# Patient Record
Sex: Male | Born: 1985 | Race: Black or African American | Hispanic: No | Marital: Single | Smoking: Former smoker
Health system: Southern US, Community
[De-identification: ages and names within clinical notes are randomized; demographics above are authoritative.]

## PROBLEM LIST (undated history)

## (undated) DIAGNOSIS — C8590 Non-Hodgkin lymphoma, unspecified, unspecified site: Secondary | ICD-10-CM

## (undated) HISTORY — PX: NECK SURGERY: SHX720

## (undated) HISTORY — PX: PORTA CATH REMOVAL: CATH118286

---

## 2009-02-07 ENCOUNTER — Emergency Department (HOSPITAL_COMMUNITY): Admission: EM | Admit: 2009-02-07 | Discharge: 2009-02-07 | Payer: Self-pay | Admitting: Emergency Medicine

## 2011-01-24 ENCOUNTER — Encounter: Payer: Self-pay | Admitting: Emergency Medicine

## 2011-01-24 ENCOUNTER — Emergency Department (HOSPITAL_COMMUNITY)
Admission: EM | Admit: 2011-01-24 | Discharge: 2011-01-25 | Discharge status: Against Medical Advice | Payer: Self-pay | Attending: Emergency Medicine | Admitting: Emergency Medicine

## 2011-01-24 DIAGNOSIS — R6883 Chills (without fever): Secondary | ICD-10-CM | POA: Insufficient documentation

## 2011-01-24 DIAGNOSIS — R059 Cough, unspecified: Secondary | ICD-10-CM | POA: Insufficient documentation

## 2011-01-24 DIAGNOSIS — R110 Nausea: Secondary | ICD-10-CM | POA: Insufficient documentation

## 2011-01-24 DIAGNOSIS — R05 Cough: Secondary | ICD-10-CM | POA: Insufficient documentation

## 2011-01-24 DIAGNOSIS — R109 Unspecified abdominal pain: Secondary | ICD-10-CM | POA: Insufficient documentation

## 2011-01-24 NOTE — ED Notes (Signed)
Pt in c/o cough and LLQ pain x2 days, states cough started first and has progressed, abd pain is intermittent, pt in no distress at this time, denies n/v, also c/o difficulty sleeping over last week

## 2011-01-24 NOTE — ED Notes (Signed)
Pt presented to the ER with c/o cold s/s and also lower abd pain and right sharp CP 6/10 at worse and at its best 4/10 . S/s started yesterday and since then s/s worsen, pt with dry cough, states when coughing noted lower abd pain, 8/10, pt reports taking goody powder, claratin, Midol, nightquil, reports difficulty sleeping.

## 2011-01-25 ENCOUNTER — Emergency Department (HOSPITAL_COMMUNITY): Payer: Self-pay
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
## 2011-01-25 NOTE — ED Notes (Signed)
Pt noted to be walking out of department, made statements of leaving earlier

## 2011-01-26 NOTE — ED Provider Notes (Signed)
Medical screening examination/treatment/procedure(s) were performed by non-physician practitioner and as supervising physician I was immediately available for consultation/collaboration.   Brenlee Koskela W Jasmarie Coppock, MD 01/26/11 0715 

## 2012-06-08 ENCOUNTER — Emergency Department (HOSPITAL_COMMUNITY)
Admission: EM | Admit: 2012-06-08 | Discharge: 2012-06-08 | Disposition: A | Discharge status: Home / Self Care | Payer: BC Managed Care – PPO | Attending: Emergency Medicine | Admitting: Emergency Medicine

## 2012-06-08 ENCOUNTER — Encounter (HOSPITAL_COMMUNITY): Payer: Self-pay | Admitting: *Deleted

## 2012-06-08 DIAGNOSIS — S025XXA Fracture of tooth (traumatic), initial encounter for closed fracture: Secondary | ICD-10-CM | POA: Insufficient documentation

## 2012-06-08 DIAGNOSIS — Y9239 Other specified sports and athletic area as the place of occurrence of the external cause: Secondary | ICD-10-CM | POA: Insufficient documentation

## 2012-06-08 DIAGNOSIS — S032XXA Dislocation of tooth, initial encounter: Secondary | ICD-10-CM

## 2012-06-08 DIAGNOSIS — Y92838 Other recreation area as the place of occurrence of the external cause: Secondary | ICD-10-CM | POA: Insufficient documentation

## 2012-06-08 DIAGNOSIS — F17200 Nicotine dependence, unspecified, uncomplicated: Secondary | ICD-10-CM | POA: Insufficient documentation

## 2012-06-08 DIAGNOSIS — Y9367 Activity, basketball: Secondary | ICD-10-CM | POA: Insufficient documentation

## 2012-06-08 DIAGNOSIS — W219XXA Striking against or struck by unspecified sports equipment, initial encounter: Secondary | ICD-10-CM | POA: Insufficient documentation

## 2012-06-08 MED ORDER — PERCOCET 5-325 MG PO TABS: 1.0000 | ORAL_TABLET | Freq: Four times a day (QID) | ORAL | Status: DC | PRN

## 2012-06-08 MED ORDER — OXYCODONE-ACETAMINOPHEN 5-325 MG PO TABS
2.0000 | ORAL_TABLET | Freq: Once | ORAL | Status: AC
  Administered 2012-06-08: 2 via ORAL
  Filled 2012-06-08: qty 2

## 2012-06-08 MED ORDER — PENICILLIN V POTASSIUM 500 MG PO TABS: 500.0000 mg | ORAL_TABLET | Freq: Four times a day (QID) | ORAL | Status: DC

## 2012-06-08 NOTE — ED Provider Notes (Signed)
Medical screening examination/treatment/procedure(s) were performed by non-physician practitioner and as supervising physician I was immediately available for consultation/collaboration.  Doug Sou, MD 06/08/12 [phone]

## 2012-06-08 NOTE — ED Notes (Signed)
Pt was elbowed to top right jaw Fri while playing basketball; states face hurts; feels like tooth broken and shoved into jaw
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
## 2012-06-09 ENCOUNTER — Encounter (HOSPITAL_COMMUNITY): Payer: Self-pay | Admitting: Emergency Medicine

## 2012-06-09 ENCOUNTER — Emergency Department (HOSPITAL_COMMUNITY)
Admission: EM | Admit: 2012-06-09 | Discharge: 2012-06-09 | Disposition: A | Discharge status: Home / Self Care | Payer: BC Managed Care – PPO | Attending: Emergency Medicine | Admitting: Emergency Medicine

## 2012-06-09 DIAGNOSIS — Y929 Unspecified place or not applicable: Secondary | ICD-10-CM | POA: Insufficient documentation

## 2012-06-09 DIAGNOSIS — X58XXXA Exposure to other specified factors, initial encounter: Secondary | ICD-10-CM | POA: Insufficient documentation

## 2012-06-09 DIAGNOSIS — S025XXD Fracture of tooth (traumatic), subsequent encounter for fracture with routine healing: Secondary | ICD-10-CM

## 2012-06-09 DIAGNOSIS — Y939 Activity, unspecified: Secondary | ICD-10-CM | POA: Insufficient documentation

## 2012-06-09 DIAGNOSIS — S025XXA Fracture of tooth (traumatic), initial encounter for closed fracture: Secondary | ICD-10-CM | POA: Insufficient documentation

## 2012-06-09 DIAGNOSIS — F17200 Nicotine dependence, unspecified, uncomplicated: Secondary | ICD-10-CM | POA: Insufficient documentation

## 2012-06-09 MED ORDER — BUPIVACAINE-EPINEPHRINE PF 0.5-1:200000 % IJ SOLN
1.8000 mL | Freq: Once | INTRAMUSCULAR | Status: AC
  Administered 2012-06-09: 9 mg
  Filled 2012-06-09: qty 1.8

## 2012-06-09 MED ORDER — OXYCODONE-ACETAMINOPHEN 5-325 MG PO TABS
2.0000 | ORAL_TABLET | Freq: Once | ORAL | Status: AC
  Administered 2012-06-09: 2 via ORAL
  Filled 2012-06-09: qty 2
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
## 2012-06-09 NOTE — Progress Notes (Signed)
CM noted pt seen x 2 on 06/09/12 [facility] ED CM noted pt with coverage but no pcp listed [facility] ED CM spoke with pt on how to obtain an in network pcp with insurance coverage via the customer service number or web site

## 2012-06-09 NOTE — ED Provider Notes (Signed)
  Medical screening examination/treatment/procedure(s) were performed by non-physician practitioner and as supervising physician I was immediately available for consultation/collaboration.    Undra Trembath, MD 06/09/12 2123 

## 2012-06-09 NOTE — ED Notes (Signed)
Increased pain in r/upper mouth due to dental injury 3 days ago

## 2012-07-11 ENCOUNTER — Emergency Department (HOSPITAL_COMMUNITY)
Admission: EM | Admit: 2012-07-11 | Discharge: 2012-07-12 | Disposition: A | Discharge status: Home / Self Care | Payer: BC Managed Care – PPO | Attending: Emergency Medicine | Admitting: Emergency Medicine

## 2012-07-11 ENCOUNTER — Encounter (HOSPITAL_COMMUNITY): Payer: Self-pay | Admitting: *Deleted

## 2012-07-11 DIAGNOSIS — K0889 Other specified disorders of teeth and supporting structures: Secondary | ICD-10-CM

## 2012-07-11 DIAGNOSIS — F17200 Nicotine dependence, unspecified, uncomplicated: Secondary | ICD-10-CM | POA: Insufficient documentation

## 2012-07-11 DIAGNOSIS — R599 Enlarged lymph nodes, unspecified: Secondary | ICD-10-CM

## 2012-07-11 DIAGNOSIS — K089 Disorder of teeth and supporting structures, unspecified: Secondary | ICD-10-CM | POA: Insufficient documentation

## 2012-07-11 LAB — CBC WITH DIFFERENTIAL/PLATELET
Basophils Relative: 0 % (ref 0–1)
Eosinophils Relative: 2 % (ref 0–5)
HCT: 28.4 % — ABNORMAL LOW (ref 39.0–52.0)
Hemoglobin: 8.9 g/dL — ABNORMAL LOW (ref 13.0–17.0)
Lymphs Abs: 2.3 10*3/uL (ref 0.7–4.0)
MCH: 23.2 pg — ABNORMAL LOW (ref 26.0–34.0)
MCV: 74.2 fL — ABNORMAL LOW (ref 78.0–100.0)
Monocytes Absolute: 1.4 10*3/uL — ABNORMAL HIGH (ref 0.1–1.0)
Monocytes Relative: 14 % — ABNORMAL HIGH (ref 3–12)
Platelets: 416 10*3/uL — ABNORMAL HIGH (ref 150–400)
RBC: 3.83 MIL/uL — ABNORMAL LOW (ref 4.22–5.81)
WBC: 10.3 10*3/uL (ref 4.0–10.5)

## 2012-07-11 LAB — BASIC METABOLIC PANEL
Chloride: 102 mEq/L (ref 96–112)
GFR calc Af Amer: >90 mL/min (ref >90)
GFR calc non Af Amer: >90 mL/min (ref >90)
Glucose, Bld: 92 mg/dL (ref 70–99)
Potassium: 3.8 mEq/L (ref 3.5–5.1)
Sodium: 139 mEq/L (ref 135–145)

## 2012-07-11 MED ORDER — AMOXICILLIN 500 MG PO CAPS: 500.0000 mg | ORAL_CAPSULE | Freq: Two times a day (BID) | ORAL | Status: DC

## 2012-07-11 MED ORDER — ONDANSETRON 4 MG PO TBDP
4.0000 mg | ORAL_TABLET | Freq: Once | ORAL | Status: AC
  Administered 2012-07-11: 4 mg via ORAL
  Filled 2012-07-11: qty 1

## 2012-07-11 MED ORDER — HYDROCODONE-ACETAMINOPHEN 5-325 MG PO TABS: 1.0000 | ORAL_TABLET | Freq: Four times a day (QID) | ORAL | Status: DC | PRN

## 2012-07-11 MED ORDER — OXYCODONE-ACETAMINOPHEN 5-325 MG PO TABS
2.0000 | ORAL_TABLET | Freq: Once | ORAL | Status: AC
  Administered 2012-07-11: 2 via ORAL
  Filled 2012-07-11: qty 2

## 2012-07-11 NOTE — ED Notes (Signed)
Pt c/o R sided facial pain secondary to an dental injury sustained x 1 month ago.
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
## 2012-07-11 NOTE — ED Notes (Signed)
Pt ambulatory to exam room. Pt with no acute distress. Pt states he walked here to ED. Pt states he can get a ride home if needed.

## 2012-07-14 ENCOUNTER — Telehealth: Payer: Self-pay | Admitting: Oncology

## 2012-07-14 NOTE — Telephone Encounter (Signed)
PT SCHEDULED PER MD 6/12

## 2012-07-16 ENCOUNTER — Ambulatory Visit: Payer: BC Managed Care – PPO

## 2012-07-16 ENCOUNTER — Other Ambulatory Visit: Payer: BC Managed Care – PPO | Admitting: Lab

## 2012-07-16 ENCOUNTER — Other Ambulatory Visit: Payer: Self-pay | Admitting: Emergency Medicine

## 2012-07-16 ENCOUNTER — Telehealth: Payer: Self-pay | Admitting: Oncology

## 2012-07-16 ENCOUNTER — Ambulatory Visit: Payer: BC Managed Care – PPO | Admitting: Oncology

## 2012-07-16 DIAGNOSIS — R599 Enlarged lymph nodes, unspecified: Secondary | ICD-10-CM

## 2012-07-16 NOTE — Telephone Encounter (Signed)
C/D 07/16/12 for appt. 07/16/12 °

## 2012-07-17 NOTE — ED Provider Notes (Signed)
Medical screening examination/treatment/procedure(s) were performed by non-physician practitioner and as supervising physician I was immediately available for consultation/collaboration.   Christopher J. Pollina, MD 07/17/12 1050 

## 2012-07-18 NOTE — ED Provider Notes (Signed)
Medical screening examination/treatment/procedure(s) were performed by non-physician practitioner and as supervising physician I was immediately available for consultation/collaboration.   Juan Olthoff J. Susan Arana, MD 07/18/12 1101 

## 2012-07-22 ENCOUNTER — Emergency Department (HOSPITAL_COMMUNITY)
Admission: EM | Admit: 2012-07-22 | Discharge: 2012-07-22 | Disposition: A | Discharge status: Home / Self Care | Payer: BC Managed Care – PPO | Attending: Emergency Medicine | Admitting: Emergency Medicine

## 2012-07-22 ENCOUNTER — Encounter (HOSPITAL_COMMUNITY): Payer: Self-pay

## 2012-07-22 DIAGNOSIS — Z87891 Personal history of nicotine dependence: Secondary | ICD-10-CM | POA: Insufficient documentation

## 2012-07-22 DIAGNOSIS — Z87828 Personal history of other (healed) physical injury and trauma: Secondary | ICD-10-CM | POA: Insufficient documentation

## 2012-07-22 DIAGNOSIS — R61 Generalized hyperhidrosis: Secondary | ICD-10-CM | POA: Insufficient documentation

## 2012-07-22 DIAGNOSIS — R599 Enlarged lymph nodes, unspecified: Secondary | ICD-10-CM

## 2012-07-22 DIAGNOSIS — K0889 Other specified disorders of teeth and supporting structures: Secondary | ICD-10-CM

## 2012-07-22 DIAGNOSIS — K089 Disorder of teeth and supporting structures, unspecified: Secondary | ICD-10-CM | POA: Insufficient documentation

## 2012-07-22 MED ORDER — HYDROCODONE-ACETAMINOPHEN 5-325 MG PO TABS: 2.0000 | ORAL_TABLET | ORAL | Status: DC | PRN

## 2012-07-22 MED ORDER — AMOXICILLIN 500 MG PO CAPS: 500.0000 mg | ORAL_CAPSULE | Freq: Three times a day (TID) | ORAL | Status: DC

## 2012-07-22 NOTE — ED Notes (Signed)
Pt states was given a referral from here to see a dentist and was unable to see him, he went to another but sent back here for another referral. Pt c/o rt upper broken tooth that needs to be removed, with facial pain and swelling, requesting a dental block for pain until he can see the dentist tomorrow

## 2012-07-22 NOTE — ED Provider Notes (Signed)
Medical screening examination/treatment/procedure(s) were performed by non-physician practitioner and as supervising physician I was immediately available for consultation/collaboration.  Derwood Kaplan, MD 07/22/12 1615

## 2012-07-22 NOTE — ED Provider Notes (Signed)
History     CSN: [id number]  Arrival date & time 07/22/12  1031   First MD Initiated Contact with Patient 07/22/12 1038      Chief Complaint  Patient presents with  . Dental Pain    (Consider location/radiation/quality/duration/timing/severity/associated sxs/prior treatment) HPI  Calvin Mercer is a 27 y.o.male presenting to the ER with complaints of  constant severe right-sided tooth pain secondary to a dental injury while playing basketball onset greater than 1 month. He was seen here previously and given a referral for a dentist but did not know he needed to call within a certain time frame. When he came a week ago it was also noted that he had large posterior cervical lymphadenopathy and was set up to see Dr. Park Breed (hem/oncc). He did not realize that the appt was set from him for 07/16/2012 and did not make it. His mom has taken over and is now coordinating getting him in to be seen. He said last time the dental block helped for 1 week. He has fnished his abx Denies fever, otalgia, neck pain, headaches, cough, and sore throat. Reports trying Orajel, acetaminophen, and BC powders, amoxicillin with no relief of symptoms.     History reviewed. No pertinent past medical history.  Past Surgical History  Procedure Laterality Date  . Neck surgery      forein body removal, pelet from the gun    Family History  Problem Relation Age of Onset  . Cancer Other     History  Substance Use Topics  . Smoking status: Former Smoker    Types: Cigarettes  . Smokeless tobacco: Not on file  . Alcohol Use: No      Review of Systems Constitutional: Negative for fever.  Soaking night sweats  HENT: Positive for dental problem.  Respiratory: Negative for cough.  Cardiovascular: Negative for chest pain.  Hematological: Positive for adenopathy.  Psychiatric/Behavioral: Negative for confusion.  All other systems reviewed and are negative.  Allergies  Review of patient's allergies  indicates no known allergies.  Home Medications   Current Outpatient Rx  Name  Route  Sig  Dispense  Refill  . acetaminophen (TYLENOL) 500 MG tablet   Oral   Take 500 mg by mouth every 6 (six) hours as needed for pain.         Marland Kitchen amoxicillin (AMOXIL) 500 MG capsule   Oral   Take 1 capsule (500 mg total) by mouth 2 (two) times daily.   20 capsule   0   . amoxicillin (AMOXIL) 500 MG capsule   Oral   Take 1 capsule (500 mg total) by mouth 3 (three) times daily.   21 capsule   0   . aspirin EC 325 MG tablet   Oral   Take 650 mg by mouth every 6 (six) hours as needed for pain.         . Aspirin-Salicylamide-Caffeine (BC FAST PAIN RELIEF) 650-195-33.3 MG PACK   Oral   Take 1 Package by mouth 2 (two) times daily as needed (pain).         . benzocaine (ORAJEL) 10 % mucosal gel   Mouth/Throat   Use as directed 1 application in the mouth or throat 3 (three) times daily as needed for pain.         Marland Kitchen HYDROcodone-acetaminophen (NORCO) 5-325 MG per tablet   Oral   Take 1-2 tablets by mouth every 6 (six) hours as needed for pain.   20 tablet   0   .  HYDROcodone-acetaminophen (NORCO/VICODIN) 5-325 MG per tablet   Oral   Take 2 tablets by mouth every 4 (four) hours as needed for pain.   12 tablet   0     BP 114/64  Pulse 99  Temp(Src) 99.9 F (37.7 C) (Oral)  Resp 20  SpO2 99%  Physical Exam Nursing note and vitals reviewed.  Constitutional: He is oriented to person, place, and time. He appears well-developed and well-nourished. No distress.  HENT:  Head: Normocephalic and atraumatic. No trismus in the jaw.  Mouth/Throat: Uvula is midline and mucous membranes are normal. No dental caries. No oropharyngeal exudate, posterior oropharyngeal edema or posterior oropharyngeal erythema.  Upper left second molar is impacted and everted 160 degrees  Eyes: EOM are normal.  Neck: Normal range of motion. Neck supple. No tracheal deviation present.  Normal passive ROM of neck   Cardiovascular: Normal rate.  Pulmonary/Chest: Effort normal. No respiratory distress.  Musculoskeletal: Normal range of motion.  Lymphadenopathy:  He has cervical adenopathy.  Bilateral firm non-tender cervical adenopathy greater than 5 cm along the posterior chain  Neurological: He is alert and oriented to person, place, and time.  Skin: Skin is warm and dry.  Psychiatric: He has a normal mood and affect. His behavior is normal.   ED Course  Dental Date/Time: 07/22/2012 11:21 AM Performed by: Dorthula Matas Authorized by: Dorthula Matas Consent: Verbal consent obtained. Risks and benefits: risks, benefits and alternatives were discussed Consent given by: patient Patient identity confirmed: verbally with patient Local anesthesia used: yes Local anesthetic: topical anesthetic and bupivacaine 0.25% without epinephrine Anesthetic total: 2 ml Patient sedated: no Comments: Pt tolerated procedure well with good pain relief   (including critical care time)   Labs Reviewed - No data to display No results found.   1. Lymph node enlargement   2. Pain, dental       MDM  Pt arranged to follow up with Dr. Welton Flakes regarding lymphadenopathy and Dr. Chales Salmon with dental and dr. Welton Flakes hem/onc  Patient has dental pain. No emergent s/sx's present. Patent airway. No trismus.  Will be given pain medication and antibiotics. I discussed the need to call dentist within 24/48 hours for follow-up. Dental referral given. Return to ED precautions given.  Pt voiced understanding and has agreed to follow-up.          Dorthula Matas, PA-C 07/22/12 1124

## 2012-07-30 ENCOUNTER — Telehealth: Payer: Self-pay | Admitting: Oncology

## 2012-07-30 NOTE — Telephone Encounter (Signed)
S/W PT IN MOTHER IN RE NP APPT 07/01 @ 11:30 W/DR. [facility] WELCOME PACKET MAILED.

## 2012-08-04 ENCOUNTER — Encounter: Payer: Self-pay | Admitting: Oncology

## 2012-08-04 ENCOUNTER — Other Ambulatory Visit (HOSPITAL_COMMUNITY)
Admission: RE | Admit: 2012-08-04 | Discharge: 2012-08-04 | Disposition: A | Discharge status: Home / Self Care | Payer: BC Managed Care – PPO | Source: Ambulatory Visit | Attending: Oncology | Admitting: Oncology

## 2012-08-04 ENCOUNTER — Telehealth: Payer: Self-pay | Admitting: Oncology

## 2012-08-04 ENCOUNTER — Ambulatory Visit (HOSPITAL_BASED_OUTPATIENT_CLINIC_OR_DEPARTMENT_OTHER): Payer: BC Managed Care – PPO | Admitting: Lab

## 2012-08-04 ENCOUNTER — Other Ambulatory Visit (HOSPITAL_BASED_OUTPATIENT_CLINIC_OR_DEPARTMENT_OTHER): Payer: BC Managed Care – PPO | Admitting: Lab

## 2012-08-04 ENCOUNTER — Ambulatory Visit (HOSPITAL_BASED_OUTPATIENT_CLINIC_OR_DEPARTMENT_OTHER): Payer: BC Managed Care – PPO | Admitting: Oncology

## 2012-08-04 ENCOUNTER — Ambulatory Visit: Payer: BC Managed Care – PPO

## 2012-08-04 VITALS — BP 131/74 | HR 80 | Temp 98.2°F | Resp 20 | Ht 73.0 in | Wt 145.8 lb

## 2012-08-04 DIAGNOSIS — D649 Anemia, unspecified: Secondary | ICD-10-CM | POA: Insufficient documentation

## 2012-08-04 DIAGNOSIS — R599 Enlarged lymph nodes, unspecified: Secondary | ICD-10-CM

## 2012-08-04 DIAGNOSIS — C8581 Other specified types of non-Hodgkin lymphoma, lymph nodes of head, face, and neck: Secondary | ICD-10-CM | POA: Insufficient documentation

## 2012-08-04 LAB — CBC WITH DIFFERENTIAL/PLATELET
Basophils Absolute: 0 10*3/uL (ref 0.0–0.1)
Eosinophils Absolute: 0.1 10*3/uL (ref 0.0–0.5)
HGB: 8.9 g/dL — ABNORMAL LOW (ref 13.0–17.1)
MONO#: 2.1 10*3/uL — ABNORMAL HIGH (ref 0.1–0.9)
NEUT#: 7.7 10*3/uL — ABNORMAL HIGH (ref 1.5–6.5)
Platelets: 397 10*3/uL (ref 140–400)
RBC: 3.77 10*6/uL — ABNORMAL LOW (ref 4.20–5.82)
RDW: 20.5 % — ABNORMAL HIGH (ref 11.0–14.6)
WBC: 12 10*3/uL — ABNORMAL HIGH (ref 4.0–10.3)

## 2012-08-04 LAB — COMPREHENSIVE METABOLIC PANEL (CC13)
ALT: 32 U/L (ref 0–55)
Albumin: 2.2 g/dL — ABNORMAL LOW (ref 3.5–5.0)
CO2: 27 mEq/L (ref 22–29)
Chloride: 100 mEq/L (ref 98–109)
Glucose: 96 mg/dl (ref 70–140)
Potassium: 4.1 mEq/L (ref 3.5–5.1)
Sodium: 137 mEq/L (ref 136–145)
Total Protein: 8.7 g/dL — ABNORMAL HIGH (ref 6.4–8.3)

## 2012-08-04 LAB — IRON AND TIBC CHCC
%SAT: 6 % — ABNORMAL LOW (ref 20–55)
Iron: 15 ug/dL — ABNORMAL LOW (ref 42–163)
TIBC: 239 ug/dL (ref 202–409)
UIBC: 224 ug/dL (ref 117–376)

## 2012-08-04 LAB — RETICULOCYTES
Immature Retic Fract: 23.2 % — ABNORMAL HIGH (ref 3.00–10.60)
Retic Ct Abs: 45.31 10*3/uL (ref 34.80–93.90)

## 2012-08-04 LAB — LACTATE DEHYDROGENASE (CC13): LDH: 478 U/L — ABNORMAL HIGH (ref 125–245)

## 2012-08-04 LAB — SEDIMENTATION RATE: Sed Rate: 136 mm/hr — ABNORMAL HIGH (ref 0–16)

## 2012-08-04 NOTE — Telephone Encounter (Signed)
, °

## 2012-08-04 NOTE — Patient Instructions (Addendum)
#1 he was seen today in medical oncology for enlarged lymph nodes in the neck on both sides. We discussed what these lymph nodes could represent. They could be reactive versus a new diagnosis of lymphoma. We talked about the different types of lymphomas including Hodgkin lymphoma and non-Hodgkin lymphoma.  #2 we discussed getting biopsies performed of the lymph nodes to make a tissue diagnosis.  #3 we discussed obtaining staging studies including getting a PET scan as well as CT of the neck chest abdomen and pelvis.  #4 you are anemic question is why we obtained iron studies as well as other blood work.  #5 I will plan on seeing you back in about 2 weeks' time for followup to discuss biopsy results as well as scan results.  Adult Hodgkin Lymphoma  Adult Hodgkin lymphoma is a cancer of the lymph system. Lymph tissue is found throughout the body. Hodgkin lymphoma can begin in almost any part of the body. It can spread to almost any tissue or organ in the body. Being young, male and having had the virus that causes mononucleosis are all things that make you more likely to get Adult Hodgkin Lymphoma. There are two main types of Hodgkin lymphoma:   Classical.  Nodular lymphocyte-predominant. Adult Hodgkin lymphoma is a type of cancer that develops in the lymph system. This is part of the body's immune system. The lymph system is made up of the following:  Lymph: Colorless, watery fluid that travels through the lymph system and carries white blood cells called lymphocytes. Lymphocytes protect the body against infections and the growth of tumors.  Lymph vessels: A network of thin tubes that collect lymph from different parts of the body and return it to the bloodstream.  Lymph nodes: Small, bean-shaped structures that filter lymph and store white blood cells that help fight infection and disease. When you have an infection, this is what is often called "swollen glands". Lymph nodes are located along  the network of lymph vessels found throughout the body. Clusters of lymph nodes are found in the:  Underarm.  Pelvis.  Neck.  Abdomen.  Groin.  Spleen: Located on the left side of the abdomen near the stomach. This organ:  Makes lymphocytes.  Filters the blood.  Stores blood cells.  Destroys old blood cells.  Thymus: An organ in which lymphocytes grow and multiply. The thymus is in the chest behind the breastbone.  Tonsils: Two small masses of lymph tissue at the back of the throat. The tonsils produce lymphocytes.  Bone marrow: The soft, spongy tissue in the center of large bones. Bone marrow produces white blood cells including:  Lymphocytes.  Red blood cells.  Platelets. CAUSES  Risk factors for adult Hodgkin lymphoma include the following:  Being in young or late adulthood.  Being male.  Being infected with the Epstein-Barr virus.  Having a first-degree relative (parent, brother, or sister) with Hodgkin lymphoma. SYMPTOMS  Other conditions may cause the same symptoms. A caregiver should be consulted if any of the following problems do not go away:  Painless, swollen lymph nodes in the neck, underarm, or groin.  Fever for no known reason.  Drenching night sweats.  Weight loss for no known reason.  Itchy skin.  Feeling very tired. DIAGNOSIS  Tests that examine lymph nodes are used to find and diagnose adult Hodgkin lymphoma. The following tests and procedures may be used:  Physical exam and history: A history of the your past illnesses and treatments will be taken.  An exam of the body will check general signs of health. This includes looking for signs of disease. This may be paying special attention to lumps, swollen glands or anything else that seems unusual.  A complete blood count (CBC) is done. The CBC is used to test for, diagnose, and monitor many different conditions.  Your blood is checked for the following:  The number of red blood cells,  white blood cells, and platelets.  The amount of hemoglobin (the protein that carries oxygen) in the red blood cells.  The portion of the sample made up of red blood cells.  Sedimentation rate: A procedure in which a sample of blood is drawn and checked for the rate at which the red blood cells settle to the bottom of the test tube.  Blood chemistry studies: A procedure in which a blood sample is checked to measure the amounts of certain substances in the blood. An unusual (higher or lower than normal) amount of a substance can be a sign of disease in the organ or tissue that makes it.  Chest x-ray: An x-ray of the organs and bones inside the chest. An x-ray is a type of energy beam that can go through the body and onto film, making a picture of areas inside the body.  CT scan (CAT scan): These are specialized x-rays. A dye may be injected into a vein or swallowed to help the organs or tissues show up better. For adult Hodgkin lymphoma, CT scans of the chest, abdomen, and pelvis are taken. A CT scan may also be known as:  Computed tomography.  Computerized tomography.  Computerized axial tomography.  PET scan (positron emission tomography scan): A procedure to find malignant tumor cells in the body. A small amount of radioactive glucose (sugar) is injected into a your vein. The PET scanner rotates around the body and makes a picture of where glucose is being used in the body. Malignant tumor cells show up brighter in the picture. These tumor cells are more active and take up more glucose than normal cells do.  Laparotomy: A surgical procedure in which an incision (cut) is made in the wall of the abdomen to check the inside of the abdomen for signs of disease. The size of the incision depends on the reason the laparotomy is being done. Sometimes organs are removed or tissue samples are taken and checked under a microscope for signs of disease. This procedure is done only if it is needed to make  decisions about treatment.  Thoracentesis: The removal of fluid from the space between the lining of the chest and the lung. The fluid is removed using a needle. A pathologist views the fluid under a microscope to look for cancer cells.  For pregnant women with Hodgkin lymphoma, staging tests protect the fetus from harmful radiation are used. These include:  MRI (magnetic resonance imaging): A procedure that uses a magnet, radio waves, and a computer to make a series of detailed pictures of areas inside the body. This procedure is also called nuclear magnetic resonance imaging (NMRI).  Ultrasound exam: A procedure in which high-energy sound waves (ultrasound) are bounced off internal tissues or organs and make echoes. The echoes form a picture of body tissues called a sonogram.  Often a small piece of tissue is removed. This is called a biopsy. A pathologist (specialist in looking at tissues) will examine the tissue. The specialist then looks for cancer cells, especially Reed-Sternberg cells. Reed-Sternberg cells are common in classical Hodgkin  lymphoma. Below are different types of biopsies performed:  Lymph node biopsy: The removal of all or part of a lymph node.  Excisional biopsy: The removal of an entire lymph node.  Incisional biopsy: The removal of part of a lymph node.  Core biopsy: The removal of part of a lymph node using a wide needle.  Bone marrow aspiration and biopsy: The removal of bone marrow, blood, and a small piece of bone by inserting a hollow needle into the hipbone or breastbone.  Immunophenotyping: A test in which the cells in a sample of blood or bone marrow are looked at under a microscope to find out if malignant lymphocytes (cancer) began from the B lymphocytes or the T lymphocytes. STAGING After adult Hodgkin lymphoma has been diagnosed, tests are done to find out if cancer cells have spread within the lymph system or to other parts of the body. The process used to  find out if cancer has spread within the lymph system or to other parts of the body is called staging. The information gathered from the staging process determines the stage of the disease. It is important to know the stage in order to plan treatment. Adult Hodgkin lymphoma may be described as follows:  A: The patient has no symptoms.  B: The patient has symptoms such as fever, weight loss, or night sweats.  E: "E" stands for extranodal and means the cancer is found in an area or organ other than the lymph nodes or has spread to tissues beyond, but near, the major lymphatic areas.  S: "S" stands for spleen and means the cancer is found in the spleen. The following stages are used for adult Hodgkin lymphoma:   Stage I - Cancer is found in one lymph node group.  Stage IE: Cancer is found in an area or organ other than the lymph nodes.  Stage II - Cancer is found in two or more lymph node groups on the same side of the diaphragm (the thin muscle below the lungs that helps breathing and separates the chest from the abdomen).  Stage IIE: Cancer is found in an area or organ other than the lymph nodes and in lymph nodes near that area or organ. Cancer may have spread to other lymph node groups on the same side of the diaphragm.  Stage III - Cancer is found in lymph node groups on both sides of the diaphragm.  Stage IIIE: Cancer is found in lymph node groups on both sides of the diaphragm and in an area or organ other than the lymph nodes.  Stage IIIS: Cancer is found in lymph node groups on both sides of the diaphragm and in the spleen.  Stage IIIS+E: Cancer is found in lymph node groups on both sides of the diaphragm, in an area or organ other than the lymph nodes, and in the spleen.  Stage III(1): Cancer is found only in the upper abdomen above the renal vein.  Stage III(2): Cancer is found in lymph nodes in the pelvis and/or near the aorta.  Stage IV - The cancer either may be found  throughout one or more organs other than the lymph nodes and may be in lymph nodes near those organs. The cancer may also be found in one organ other than the lymph nodes and has spread to lymph nodes far away from that organ. In stage IV, Adult Hodgkin lymphoma may be grouped for treatment as follows:  Early Favorable.  Early Unfavorable.  Advanced Favorable.  Advanced Unfavorable. TREATMENT  There are different types of treatment for patients with adult Hodgkin lymphoma. The treatment is generally planned by a team of caregivers with expertise in treating lymphomas. Treatment varies with the stage of your disease. Treatment for adults may be different than treatment for children. Hodgkin lymphoma may also occur in patients who have acquired immunodeficiency syndrome (AIDS); these patients require special treatment. Choosing the most appropriate cancer treatment is a decision that ideally involves the patient, family, and health care team. Three types of standard treatment are used:   Chemotherapy. This is treatment with medications which kill cancer.  Radiation therapy. This is high dose x-ray treatment of the tumor.  Surgery. This is done to remove areas high in tumor such as the spleen. New types of treatment are being tested in clinical trials. These include the following:   High-dose chemotherapy.  Radiation therapy with stem cell transplant. HODGKIN LYMPHOMA DURING PREGNANCY When Hodgkin lymphoma is diagnosed in the second half of pregnancy, most patients can delay treatment until after the baby is born. Treatment of Hodgkin lymphoma during the second half of pregnancy may include the following:   Watchful waiting, with plans to induce delivery when the fetus is 63 to 24 weeks old.  Systemic chemotherapy using one or more drugs.  Steroid therapy.  Radiation therapy to relieve breathing problems caused by a large tumor in the chest.  Recurrent Adult Hodgkin Lymphoma. Treatment  of recurrent Hodgkin lymphoma may include the following:  Combination chemotherapy.  Combination chemotherapy followed by high-dose chemotherapy and stem cell transplant with or without radiation therapy.  Radiation therapy with or without chemotherapy.  Chemotherapy as palliative therapy to relieve symptoms and improve quality of life.  A clinical trial of high-dose chemotherapy and stem cell transplant.  This summary section refers to specific treatments under study in clinical trials, but it may not mention every new treatment being studied. Information about ongoing clinical trials is available from the [employer] site. For Hodgkin lymphoma during pregnancy, treatment options also depend on:  The wishes of the patient.  The age of the fetus. Adult Hodgkin lymphoma can often be cured if found and treated early. FOR MORE INFORMATION National Cancer Institute: www.cancer.gov Document Released: 04/30/2007 Document Revised: 04/15/2011 Document Reviewed: 04/30/2007 [facility] Patient Information 2014 [place], [place].  Non-Hodgkin's Lymphoma, Adult Non-Hodgkin's lymphoma is a cancer that begins in the lymphoid tissue (part of your body's defense system, which protects the body from infections, germs, and diseases). Lymphocytes (a type of white blood cells) are found in the lymphoid tissue. Non-Hodgkin's lymphoma starts in lymphocytes. There are different types of non-Hodgkin's lymphoma. Your caregiver will help you understand the seriousness of your cancer. This will be based on the type of cells affected and how fast it is growing and spreading. CAUSES  Non-Hodgkin's lymphoma is a cancer that starts in lymphocytes. The cause of non-Hodgkin's lymphoma is not known. It is thought that viruses cause certain types of non-Hodgkin's lymphoma. But this is less common. The risk of getting this cancer increases if:   Your immune system (body's defense system) is weak, especially after an organ  transplant.  You are an elderly white male.  You have a diet high in fat.  You are infected with certain viruses. SYMPTOMS  Non-Hodgkin's lymphoma can occur at any age. It can cause different symptoms such as:  Swelling of the lymph nodes.  Fever.  Excessive sweating.  Itchy skin.  Tiredness.  Weight loss.  Coughing, breathing trouble,  and chest pain.  Weakness and tiredness that do not go away.  Pain, swelling. or a feeling of fullness in the abdomen. DIAGNOSIS  Your caregiver will examine you to check your general health and look for any lumps. Blood tests and biopsy (removing body tissue for testing) of the lymph node (gland) may be included. Your caregiver may suggest chest X-ray, scanning or lumbar puncture (collecting fluid from spinal column for testing).  TREATMENT  Non-Hodgkin's lymphoma can be treated in different ways. This depends on your symptoms, the stage of your cancer when you were first diagnosed, and the speed with which it is spreading. You and your caregiver will work together and decide on the best plan. Treatment may include:  Radiation therapy (using radiation to destroy the cancer cells).  Chemotherapy (using drugs to destroy the cancer cells).  Biological therapy (using body's immune system to treat cancer) like monoclonal antibody therapy (using antibodies that can kill or block the cancer cells).  Newer types of treatment are vaccine therapy and high-dose chemotherapy with stem cell (a type of cell) transplant (introducing healthy stem cells into the body). However, these are still under development. You may remain symptom-free for 1 to 2 years, after treatment. Non-Hodgkin's lymphoma may recur. If it recurs, your caregiver will advise you on the suitable treatment. If you are pregnant and also have non-Hodgkin's lymphoma, you need immediate treatment. Your caregiver will decide the treatment that suits you the most.  SEEK MEDICAL CARE IF:   You  develop new symptoms of non-Hodgkin's lymphoma.  You have non-Hodgkin's lymphoma and continuous fever. Document Released: 08/04/2006 Document Revised: 04/15/2011 Document Reviewed: 08/04/2006 [facility] Patient Information 2014 [place], [place].

## 2012-08-04 NOTE — Progress Notes (Signed)
Checked in new patient. No financial issues. All communication via email, but added ph and mail.

## 2012-08-10 ENCOUNTER — Ambulatory Visit (HOSPITAL_COMMUNITY): Payer: BC Managed Care – PPO

## 2012-08-12 ENCOUNTER — Other Ambulatory Visit (HOSPITAL_COMMUNITY): Payer: BC Managed Care – PPO

## 2012-08-13 ENCOUNTER — Other Ambulatory Visit: Payer: Self-pay | Admitting: Radiology

## 2012-08-13 ENCOUNTER — Ambulatory Visit (HOSPITAL_COMMUNITY)
Admission: RE | Admit: 2012-08-13 | Discharge: 2012-08-13 | Disposition: A | Discharge status: Home / Self Care | Payer: BC Managed Care – PPO | Source: Ambulatory Visit | Attending: Oncology | Admitting: Oncology

## 2012-08-13 ENCOUNTER — Encounter (HOSPITAL_COMMUNITY)
Admission: RE | Admit: 2012-08-13 | Discharge: 2012-08-13 | Disposition: A | Discharge status: Home / Self Care | Payer: BC Managed Care – PPO | Source: Ambulatory Visit | Attending: Oncology | Admitting: Oncology

## 2012-08-13 DIAGNOSIS — R599 Enlarged lymph nodes, unspecified: Secondary | ICD-10-CM | POA: Insufficient documentation

## 2012-08-13 DIAGNOSIS — C8581 Other specified types of non-Hodgkin lymphoma, lymph nodes of head, face, and neck: Secondary | ICD-10-CM

## 2012-08-13 DIAGNOSIS — C8588 Other specified types of non-Hodgkin lymphoma, lymph nodes of multiple sites: Secondary | ICD-10-CM | POA: Insufficient documentation

## 2012-08-13 MED ORDER — IOHEXOL 300 MG/ML  SOLN
100.0000 mL | Freq: Once | INTRAMUSCULAR | Status: AC | PRN
  Administered 2012-08-13: 100 mL via INTRAVENOUS

## 2012-08-13 MED ORDER — FLUDEOXYGLUCOSE F - 18 (FDG) INJECTION
18.8000 | Freq: Once | INTRAVENOUS | Status: AC | PRN
  Administered 2012-08-13: 18.8 via INTRAVENOUS

## 2012-08-16 NOTE — Progress Notes (Signed)
[facility]  Telephone:([phone] Fax:([phone]     INITIAL HEMATOLOGY CONSULTATION    Referral MD:   Dr. Jaci Carrel  Reason for Referral: 27 year old gentleman with lymphadenopathy bilaterally of the cervical and supraclavicular nodes suspicious for primary lymphoma    HPI: patient is a very pleasant 27 year old gentleman who has been seen in the emergency room multiple times.he recently presented on 07/11/2012 with complaints of constant severe right-sided tooth pain secondary to dental injury while playing basketball. This began about a month ago. He otherwise denies any fevers but pathology neck pain headaches cough or sore throat. Patient had been taking Orajel acetaminophen and BC powders without any relief. During his examination in the emergency room he was noted to have bilateral cervical and supraclavicular lymphadenopathy. Because of this he was referred to medical oncology for further workup. The lymph nodes were enlarged in the cervical chain there were greater than 5 cm along the posterior chain. Upon questioning patient states that they have been there for a long time they're nontender non-mobile. He really has not had any workup for this in the past today he denies any fevers chills night sweats headaches shortness of breath chest pains palpitations no myalgias and arthralgias no nausea or vomiting no weight loss. Remainder of the 14 point review of systems is negative.    No past medical history on file.:    Past Surgical History  Procedure Laterality Date  . Neck surgery      forein body removal, pelet from the gun  :   CURRENT MEDS: Current Outpatient Prescriptions  Medication Sig Dispense Refill  . acetaminophen (TYLENOL) 500 MG tablet Take 500 mg by mouth every 6 (six) hours as needed for pain.      Marland Kitchen aspirin EC 325 MG tablet Take 650 mg by mouth every 6 (six) hours as needed for pain.      . benzocaine (ORAJEL) 10 % mucosal  gel Use as directed 1 application in the mouth or throat 3 (three) times daily as needed for pain.      . Aspirin-Salicylamide-Caffeine (BC FAST PAIN RELIEF) 650-195-33.3 MG PACK Take 1 Package by mouth 2 (two) times daily as needed (pain).       No current facility-administered medications for this visit.      No Known Allergies:  Family History  Problem Relation Age of Onset  . Cancer Other   :  History   Social History  . Marital Status: Single    Spouse Name: N/A    Number of Children: N/A  . Years of Education: N/A   Occupational History  . Not on file.   Social History Main Topics  . Smoking status: Former Smoker    Types: Cigarettes  . Smokeless tobacco: Not on file  . Alcohol Use: No  . Drug Use: No  . Sexually Active: Yes    Birth Control/ Protection: None   Other Topics Concern  . Not on file   Social History Narrative  . No narrative on file  :  REVIEW OF SYSTEM:  The rest of the 14-point review of sytem was negative.   Exam: Filed Vitals:   08/04/12 1210  BP: 131/74  Pulse: 80  Temp: 98.2 F (36.8 C)  TempSrc: Oral  Resp: 20  Height: 6\' 1"  (1.854 m)  Weight: 145 lb 12.8 oz (66.134 kg)    General:  well-nourished in no acute distress.  Eyes:  no scleral icterus.  ENT:  There were no oropharyngeal lesions.  Neck was without thyromegaly.  Lymphatics:significantly enlarged cervical, supraclavicular or axillary adenopathy.  Respiratory: lungs were clear bilaterally without wheezing or crackles.  Cardiovascular:  Regular rate and rhythm, S1/S2, without murmur, rub or gallop.  There was no pedal edema.  GI:  abdomen was soft, flat, nontender, nondistended, without organomegaly.  Muscoloskeletal:  no spinal tenderness of palpation of vertebral spine.  Skin exam was without echymosis, petichae.  Neuro exam was nonfocal.  Patient was able to get on and off exam table without assistance.  Gait was normal.  Patient was alerted and oriented.  Attention was  good.   Language was appropriate.  Mood was normal without depression.  Speech was not pressured.  Thought content was not tangential.    LABS:  Lab Results  Component Value Date   WBC 12.0* 08/04/2012   HGB 8.9* 08/04/2012   HCT 28.1* 08/04/2012   PLT 397 08/04/2012   GLUCOSE 96 08/04/2012   ALT 32 08/04/2012   AST 19 08/04/2012   NA 137 08/04/2012   K 4.1 08/04/2012   CL 102 07/11/2012   CREATININE 0.8 08/04/2012   BUN 9.8 08/04/2012   CO2 27 08/04/2012      ASSESSMENT AND PLAN: 27 year old male with  #1 bilateral cervical and supraclavicular adenopathy unclear etiology. This could be reactive versus a lymphoma. I have discussed the differential diagnosis with the patient. She understands that this could certainly be from his multiple chronic dental issues. However I do think that a primary underlying malignancy does need to be ruled out such as a lymphoma. We discussed the different types of lymphomas including Hodgkin's versus non-Hodgkin's. He understands that a biopsy does need to be performed because of the differences in the different type of lymphomas that can be seen in a younger patient population. Certainly he could have Hodgkin lymphoma as well as non-Hodgkin. He understands that treatments are different as well.  #2 we discussed the workup I do think first he needs scanning studies including getting CT of the chest abdomen pelvis and neck. We will also get a PET scan performed as well.  #3 to make a tissue diagnosis I will refer the patient to interventional radiology to have a biopsy performed of one of the largest neck nodes. If this is inconclusive then we will have an excisional lymph node biopsy performed by one of the general surgeon or ENT surgeons. All of this was clearly explained to the patient at this sitting  #4 he will be seen back after his scans and biopsy.  I spent a total of 60 minutes in this visit greater than 50% of the visit was spent in counseling and coordination of care  for this patient.  Drue Second, MD Medical/Oncology [facility] [phone] (beeper) [phone] (Office)

## 2012-08-17 ENCOUNTER — Ambulatory Visit (HOSPITAL_COMMUNITY)
Admission: RE | Admit: 2012-08-17 | Discharge: 2012-08-17 | Disposition: A | Discharge status: Home / Self Care | Payer: BC Managed Care – PPO | Source: Ambulatory Visit | Attending: Oncology | Admitting: Oncology

## 2012-08-17 ENCOUNTER — Encounter (HOSPITAL_COMMUNITY): Payer: Self-pay

## 2012-08-17 DIAGNOSIS — C8191 Hodgkin lymphoma, unspecified, lymph nodes of head, face, and neck: Secondary | ICD-10-CM | POA: Insufficient documentation

## 2012-08-17 DIAGNOSIS — C8581 Other specified types of non-Hodgkin lymphoma, lymph nodes of head, face, and neck: Secondary | ICD-10-CM

## 2012-08-17 LAB — CBC
HCT: 25 % — ABNORMAL LOW (ref 39.0–52.0)
Platelets: 343 10*3/uL (ref 150–400)
RDW: 18.5 % — ABNORMAL HIGH (ref 11.5–15.5)
WBC: 10.8 10*3/uL — ABNORMAL HIGH (ref 4.0–10.5)

## 2012-08-17 LAB — APTT: aPTT: 44 seconds — ABNORMAL HIGH (ref 24–37)

## 2012-08-17 LAB — PROTIME-INR
INR: 1.24 (ref 0.00–1.49)
Prothrombin Time: 15.3 seconds — ABNORMAL HIGH (ref 11.6–15.2)

## 2012-08-17 MED ORDER — FENTANYL CITRATE 0.05 MG/ML IJ SOLN
INTRAMUSCULAR | Status: AC
  Filled 2012-08-17: qty 6

## 2012-08-17 MED ORDER — MIDAZOLAM HCL 2 MG/2ML IJ SOLN
INTRAMUSCULAR | Status: AC | PRN
  Administered 2012-08-17 (×3): 1 mg via INTRAVENOUS

## 2012-08-17 MED ORDER — SODIUM CHLORIDE 0.9 % IV SOLN
INTRAVENOUS | Status: DC
  Administered 2012-08-17: 20 mL/h via INTRAVENOUS

## 2012-08-17 MED ORDER — MIDAZOLAM HCL 2 MG/2ML IJ SOLN
INTRAMUSCULAR | Status: AC
  Filled 2012-08-17: qty 6

## 2012-08-17 MED ORDER — FENTANYL CITRATE 0.05 MG/ML IJ SOLN
INTRAMUSCULAR | Status: AC | PRN
  Administered 2012-08-17: 50 ug via INTRAVENOUS
  Administered 2012-08-17: 100 ug via INTRAVENOUS

## 2012-08-17 NOTE — H&P (Signed)
Calvin Mercer is an 27 y.o. male.   Chief Complaint: "I'm here for a biopsy" HPI: Patient with no significant PMH but recent PET scan revealing diffuse hypermetabolic lymphadenopathy suspicious for lymphoma presents today for US guided cervical or supraclavicular lymph node biopsy.  History reviewed. No pertinent past medical history.  Past Surgical History  Procedure Laterality Date  . Neck surgery      forein body removal, pelet from the gun    Family History  Problem Relation Age of Onset  . Cancer Other    Social History:  reports that he has quit smoking. His smoking use included Cigarettes. He smoked 0.00 packs per day. He does not have any smokeless tobacco history on file. He reports that he does not drink alcohol or use illicit drugs.  Allergies: No Known Allergies  Current outpatient prescriptions:acetaminophen (TYLENOL) 500 MG tablet, Take 500 mg by mouth every 6 (six) hours as needed for pain., Disp: , Rfl: ;  Aspirin-Salicylamide-Caffeine (BC FAST PAIN RELIEF) 650-195-33.3 MG PACK, Take 1 Package by mouth 2 (two) times daily as needed (pain)., Disp: , Rfl: ;  benzocaine (ORAJEL) 10 % mucosal gel, Use as directed 1 application in the mouth or throat 3 (three) times daily as needed for pain., Disp: , Rfl:  aspirin EC 325 MG tablet, Take 650 mg by mouth every 6 (six) hours as needed for pain., Disp: , Rfl:  No current facility-administered medications for this encounter. Facility-Administered Medications Ordered in Other Encounters: 0.9 %  sodium chloride infusion, , Intravenous, Continuous, Brayton El, PA-C, Last Rate: 20 mL/hr at 08/17/12 1130, 20 mL/hr at 08/17/12 1130;  fentaNYL (SUBLIMAZE) 0.05 MG/ML injection, , , , ;  midazolam (VERSED) 2 MG/2ML injection, , , ,    Results for orders placed during the hospital encounter of 08/13/12  GLUCOSE, CAPILLARY      Result Value Range   Glucose-Capillary 101 (*) 70 - 99 mg/dL    Review of Systems  Constitutional:  Positive for weight loss. Negative for fever and chills.       Night sweats  HENT:       Tooth pain secondary to retained wisdom teeth  Respiratory: Negative for cough and shortness of breath.   Cardiovascular: Negative for chest pain.  Gastrointestinal: Negative for nausea, vomiting and abdominal pain.  Musculoskeletal: Negative for back pain.  Neurological: Negative for headaches.  Endo/Heme/Allergies: Does not bruise/bleed easily.    Blood pressure 126/65, pulse 94, temperature 98.6 F (37 C), temperature source Oral, resp. rate 20, height 6\' 1"  (1.854 m), weight 145 lb (65.772 kg), SpO2 100.00%. Physical Exam  Constitutional: He is oriented to person, place, and time. He appears well-developed and well-nourished.  Neck:  Positive cervical (primarily posterior), supraclavicular adenopathy  Cardiovascular: Normal rate and regular rhythm.   Respiratory: Effort normal and breath sounds normal.  GI: Soft. Bowel sounds are normal. There is no tenderness.  Musculoskeletal: Normal range of motion. He exhibits no edema.  Lymphadenopathy:    He has cervical adenopathy.  Neurological: He is alert and oriented to person, place, and time.     Assessment/Plan Pt with diffuse hypermetabolic lymphadenopathy on recent PET scan suspicious for lymphoma. Plan is for US guided cervical or supraclavicular lymph node biopsy today. Details/risks of procedure d/w pt with his understanding and consent.  Janiyla Long,D KEVIN 08/17/2012, 12:37 PM

## 2012-08-17 NOTE — Procedures (Signed)
L supraclavicular LN Bx 18 gauge core times 4 No comp

## 2012-08-20 ENCOUNTER — Ambulatory Visit: Payer: BC Managed Care – PPO | Admitting: Oncology

## 2012-08-25 ENCOUNTER — Telehealth: Payer: Self-pay | Admitting: Medical Oncology

## 2012-08-25 NOTE — Telephone Encounter (Signed)
Patient's mother called wanting to r/s patient's missed appt from 07/17 to this Thursday 08/28/22. Informed mother will need to review availability and have schedulers call back to schedule. Mother expressed understanding.

## 2012-08-26 ENCOUNTER — Ambulatory Visit: Payer: BC Managed Care – PPO | Admitting: Oncology

## 2012-08-27 ENCOUNTER — Telehealth: Payer: Self-pay | Admitting: Medical Oncology

## 2012-08-27 ENCOUNTER — Ambulatory Visit (HOSPITAL_BASED_OUTPATIENT_CLINIC_OR_DEPARTMENT_OTHER): Payer: BC Managed Care – PPO | Admitting: Oncology

## 2012-08-27 VITALS — BP 111/62 | HR 99 | Temp 99.0°F | Resp 20 | Ht 73.0 in | Wt 140.0 lb

## 2012-08-27 DIAGNOSIS — C8193 Hodgkin lymphoma, unspecified, intra-abdominal lymph nodes: Secondary | ICD-10-CM

## 2012-08-27 DIAGNOSIS — C8592 Non-Hodgkin lymphoma, unspecified, intrathoracic lymph nodes: Secondary | ICD-10-CM

## 2012-08-27 DIAGNOSIS — C8582 Other specified types of non-Hodgkin lymphoma, intrathoracic lymph nodes: Secondary | ICD-10-CM

## 2012-08-27 NOTE — Telephone Encounter (Signed)
Patient's mother called stating she felt it would be best for patient to be referred for treatment closer to home and asked that Dr Welton Flakes would send a referral to [facility] [place] so patient can get chemotherapy treatment started there. Message give to Dr Welton Flakes.

## 2012-08-28 ENCOUNTER — Telehealth: Payer: Self-pay | Admitting: Medical Oncology

## 2012-08-28 NOTE — Telephone Encounter (Signed)
sw pt mother gv the appt for Dr. Tawanna Cooler for 09/17/12@ 12:30pm. The mother stated that not where she wants she son to attend care, she want him to go [facility]...td

## 2012-08-28 NOTE — Telephone Encounter (Signed)
Patient's mother called asking for a referral from Dr Welton Flakes for patient to be referred to the [facility] in [place], states it's part of [place] and they have a specialist there for her son. Asking for referral to made today d/t center stating if referral made today they can set patient up for treatment next week.  Mssg forwarded to MD.

## 2012-09-01 ENCOUNTER — Other Ambulatory Visit: Payer: Self-pay | Admitting: Emergency Medicine

## 2012-09-01 DIAGNOSIS — C8190 Hodgkin lymphoma, unspecified, unspecified site: Secondary | ICD-10-CM

## 2012-09-03 ENCOUNTER — Telehealth: Payer: Self-pay | Admitting: Oncology

## 2012-09-03 NOTE — Telephone Encounter (Signed)
Pt appt. With [facility] is 09/03/12

## 2012-09-03 NOTE — Telephone Encounter (Signed)
Medical records faxed and scans were requested to be fedex'ed  To [facility]

## 2012-09-13 NOTE — Progress Notes (Signed)
OFFICE PROGRESS NOTE  CC Dr. Jaci Carrel  DIAGNOSIS: 27 year old gentleman with lymphadenopathy bilaterally of the cervical and supraclavicular nodes with new diagnosis of Hodgkin lymphoma   PRIOR THERAPY: 25. 27 year old gentleman who has been seen in the emergency room multiple times.he recently presented on 07/11/2012 with complaints of constant severe right-sided tooth pain secondary to dental injury while playing basketball. This began about a month ago. He otherwise denies any fevers but pathology neck pain headaches cough or sore throat. Patient had been taking Orajel acetaminophen and BC powders without any relief. During his examination in the emergency room he was noted to have bilateral cervical and supraclavicular lymphadenopathy  2. CT CHEST, ABDOMEN AND PELVIS WITH CONTRAST: Bulky mediastinal and bilateral hilar lymphadenopathy.. Lower cervical lymphadenopathy also noted in the  supraclavicular regions and posterior triangles bilaterallyAbdominal and pelvic lymphadenopathy, as described above.  Splenic involvement by lymphoma.   Diffuse bone marrow involvement is obvious by PET scan, but poorly visualized by CT  3.NUCLEAR MEDICINE PET SKULL BASE TO THIGH:Neck: Hypermetabolic lymphadenopathy is seen in the jugular lymph node chains and posterior triangles bilaterally. Hypermetabolic lymphadenopathy also seen within the supraclavicular regions bilaterally.  Chest: Bulky mediastinal lymphadenopathy shows hypermetabolic activity, with maximum SUV of 20.5 in the right paratracheal region and 18.5 in the left para-aortic andAP window regions. Hypermetabolic activity is also seen in the hilar regions bilaterally, right side greater than left. Hypermetabolic adenopathy also seen in the right cardiophrenic angle. Abdomen/Pelvis: Bulky hypermetaboliclymphadenopathy is seen in the upper abdomen within the gastrohepatic and gastrosplenic  ligaments and porta hepatis, with maximum SUV  measuring 29.3. Multiple hypermetabolic lesions are seen involving the splenic parenchyma. Hypermetabolic lymphadenopathy is seen in the retroperitoneum in the retrocaval, aortocaval, and left periaortic spaces. There is also hypermetabolic adenopathy in the right obturator and bilateral external iliac lymph node chains in the pelvis. Skeleton: Numerous hypermetabolic bone lesions are seen throughout  the spine, sternum, ribs, scapulae, right humerus, and pelvis, consistent with involvement by lymphoma.  4.Lymph node, needle/core biopsy, left supraclavicular - CLASSICAL HODGKIN'S LYMPHOMA. - SEE ONCOLOGY TABLE. Microscopic Comment LYMPHOMA Histologic type: Classical Hodgkin's lymphoma Grade (if applicable): N/A Flow cytometry: Not performed Immunohistochemical stains: EMA, LCA, CD15, CD30, CD3, CD43, CD20, CD4, CD8 and ALK protein with appropriate controls Touch preps/imprints: Not done Comments: The sections show lymph nodal tissue displaying effacement of the architecture by a polymorphous cellular process characterized by relative abundance of large atypical mononuclear and multilobated lymphoid cells with prominent nucleoli. This is seen in a background containing small lymphocytes, plasma cells and eosinophils. Patchy areas of fibrosis are also present. To further evaluate this process, immunohistochemical stains were performed and show that the large atypical lymphoid cells are positive for CD30 and a few cells are positive for CD15. The large atypical lymphoid cells are negative for EMA, ALK protein, LCA, CD3, CD43, CD20, CD4 and CD8. The small lymphocytic population in the background is primarily composed of T-cells with predominance of CD4. The overall findings are consistent with classical Hodgkin's lymphoma  5.patient is recommended further staging studies including getting a bone marrow biopsy and aspirate performed. We also discussed today treatment. He will need treatment with  ABVD.  CURRENT THERAPY:patient undergone treatment planning for Hodgkin lymphoma  INTERVAL HISTORY: Calvin Mercer 27 y.o. male returns for followup visit today. Overall she seems to be doing well. He is accompanied by his mother. We discussed the pathology results today as well as further staging workup including a bone marrow biopsy and aspirate. We  also discussed treatment options. Patient is having night sweats. He denies pruritus. He has no nausea or vomiting. His appetite seems to be pretty stable. He does not have any pain. Remainder of the 10 point review of systems is negative.  MEDICAL HISTORY:No past medical history on file.  ALLERGIES:  has No Known Allergies.  MEDICATIONS:  Current Outpatient Prescriptions  Medication Sig Dispense Refill  . acetaminophen (TYLENOL) 500 MG tablet Take 500 mg by mouth every 6 (six) hours as needed for pain.      Marland Kitchen aspirin EC 325 MG tablet Take 650 mg by mouth every 6 (six) hours as needed for pain.      . Aspirin-Salicylamide-Caffeine (BC FAST PAIN RELIEF) 650-195-33.3 MG PACK Take 1 Package by mouth 2 (two) times daily as needed (pain).      . benzocaine (ORAJEL) 10 % mucosal gel Use as directed 1 application in the mouth or throat 3 (three) times daily as needed for pain.       No current facility-administered medications for this visit.    SURGICAL HISTORY:  Past Surgical History  Procedure Laterality Date  . Neck surgery      forein body removal, pelet from the gun    REVIEW OF SYSTEMS:  Pertinent items are noted in HPI.   HEALTH MAINTENANCE:  PHYSICAL EXAMINATION: Blood pressure 111/62, pulse 99, temperature 99 F (37.2 C), temperature source Oral, resp. rate 20, height 6\' 1"  (1.854 m), weight 140 lb (63.504 kg). Body mass index is 18.47 kg/(m^2). ECOG PERFORMANCE STATUS: 0 - Asymptomatic   General appearance: alert, cooperative and appears stated age Resp: clear to auscultation bilaterally Back: symmetric, no curvature. ROM  normal. No CVA tenderness. Cardio: regular rate and rhythm GI: soft, non-tender; bowel sounds normal; no masses,  no organomegaly Extremities: extremities normal, atraumatic, no cyanosis or edema Neurologic: Grossly normal   LABORATORY DATA: Lab Results  Component Value Date   WBC 10.8* 08/17/2012   HGB 7.9* 08/17/2012   HCT 25.0* 08/17/2012   MCV 74.6* 08/17/2012   PLT 343 08/17/2012      Chemistry      Component Value Date/Time   NA 137 08/04/2012 1159   NA 139 07/11/2012 2203   K 4.1 08/04/2012 1159   K 3.8 07/11/2012 2203   CL 102 07/11/2012 2203   CO2 27 08/04/2012 1159   CO2 27 07/11/2012 2203   BUN 9.8 08/04/2012 1159   BUN 8 07/11/2012 2203   CREATININE 0.8 08/04/2012 1159   CREATININE 0.76 07/11/2012 2203      Component Value Date/Time   CALCIUM 9.7 08/04/2012 1159   CALCIUM 9.1 07/11/2012 2203   ALKPHOS 232* 08/04/2012 1159   AST 19 08/04/2012 1159   ALT 32 08/04/2012 1159   BILITOT 0.25 08/04/2012 1159       RADIOGRAPHIC STUDIES:  US Biopsy  08/17/2012   *RADIOLOGY REPORT*  Clinical Data/Indication: Lymphadenopathy  ULTRASOUND-GUIDED BIOPSY OF A LEFT SUPRACLAVICULAR LYMPH NODE. CORE.  Sedation: Versed 3.0 mg, Fentanyl 150 mcg.  Total Moderate Sedation Time: Hand minutes.  Procedure: The procedure, risks, benefits, and alternatives were explained to the patient. Questions regarding the procedure were encouraged and answered. The patient understands and consents to the procedure.  The  was prepped with betadine in a sterile fashion, and a sterile drape was applied covering the operative field. A sterile gown and sterile gloves were used for the procedure.  Under sonographic guidance, four 18 gauge core biopsies of the enlarged left supraclavicular lymph  node were obtained. Final imaging was performed.  Patient tolerated the procedure well without complication.  Vital sign monitoring by nursing staff during the procedure will continue as patient is in the special procedures unit for post procedure  observation.  Findings: The images document guide needle placement within the enlarged left supraclavicular lymph node. Post biopsy images demonstrate no evidence of hemorrhage.  IMPRESSION: Successful ultrasound-guided core biopsy of a enlarged left supraclavicular lymph node.   Original Report Authenticated By: Jolaine Click, M.D.    ASSESSMENT: 27 year old gentleman with  #1 new diagnosis of Hodgkin lymphoma classical type. He certainly does need further staging workup including a bone marrow biopsy and aspirate. He needs treatment for his disease as well. I have recommended ABVD combination chemotherapy. However patient and his mom would like to be seen in [place].  #2 anemia: Likely due to underlying bone marrow involvement of lymphoma.   PLAN:   #1 patient will be referred closer to home for treatment of his lymphoma.  #2 certainly did offer to treat the patient with the first cycle here until arrangements can be made closer to home. But they have declined.   All questions were answered. The patient knows to call the clinic with any problems, questions or concerns. We can certainly see the patient much sooner if necessary.  I spent 25 minutes counseling the patient face to face. The total time spent in the appointment was 30 minutes.    Drue Second, MD Medical/Oncology [facility] [phone] (beeper) [phone] (Office)

## 2013-07-07 ENCOUNTER — Telehealth: Payer: Self-pay

## 2013-07-07 NOTE — Telephone Encounter (Signed)
Copy to [facility]

## 2013-07-07 NOTE — Telephone Encounter (Signed)
Received copy of treatment summary for pt dated Jun 10, 2013, Dr. Rebbeca Paul.  Sent to scan.

## 2014-08-07 IMAGING — CT CT CHEST W/ CM
4 of 8 series · 15 of 36 positions shown, 17 images · IV contrast (OMNIPAQUE)
Comparison: None.

CT CHEST

CLINICAL DATA: Newly diagnosed lymphoma.

CT CHEST, ABDOMEN AND PELVIS WITH CONTRAST
TECHNIQUE: Multidetector CT imaging of the chest, abdomen and
pelvis was performed following the standard protocol during bolus
administration of intravenous contrast.
Contrast: 100mL OMNIPAQUE IOHEXOL 300 MG/ML  SOLN

[Series 2: cap st · axial · 0.67mm/px · z∈[-702,-222]mm · 6 of 136 slices shown, 8 images]
[im 20/136  mediastinal]
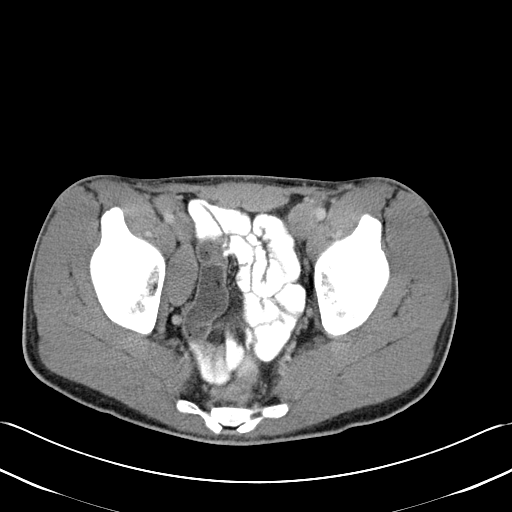
[im 20/136  lung]
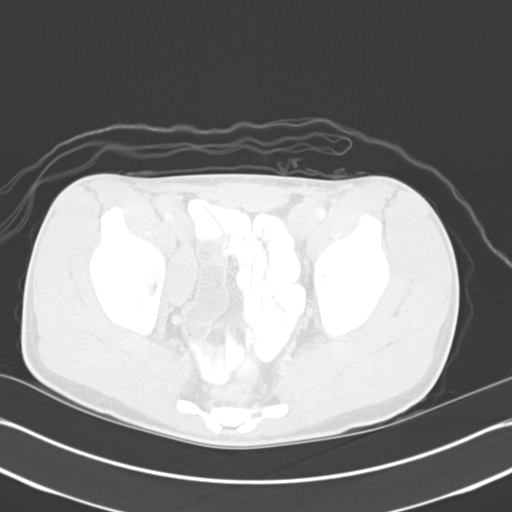
[im 39/136  lung]
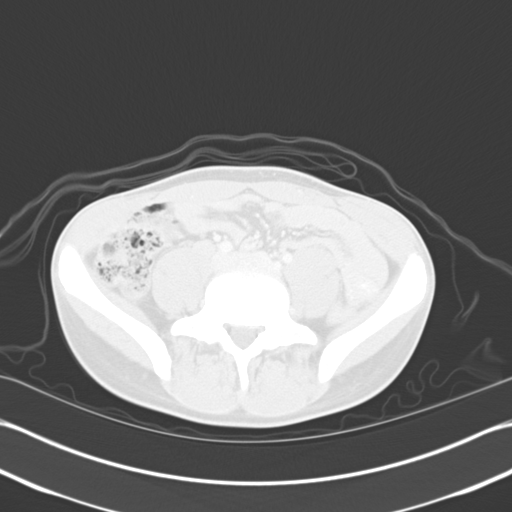
[im 58/136  lung]
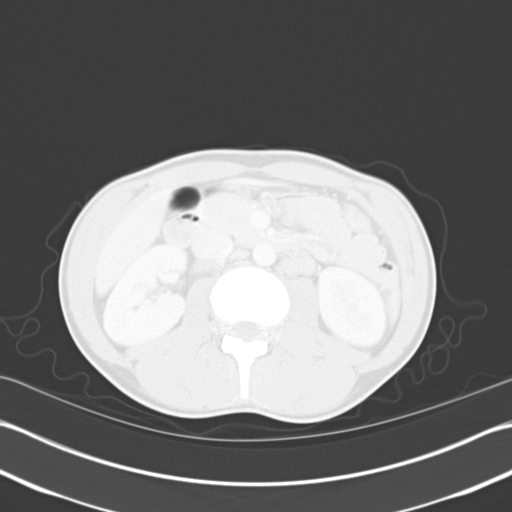
[im 78/136  lung]
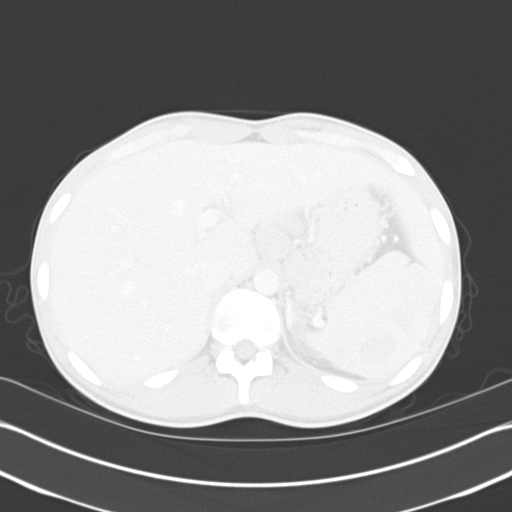
[im 97/136  mediastinal]
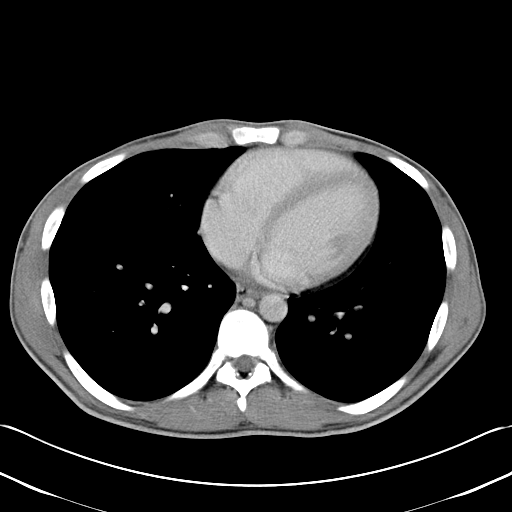
[im 97/136  lung]
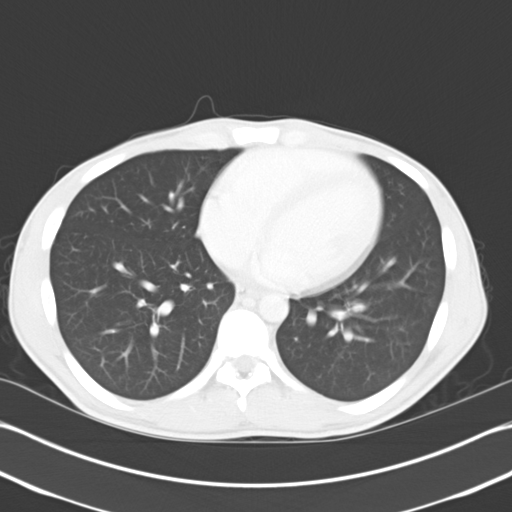
[im 116/136  lung]
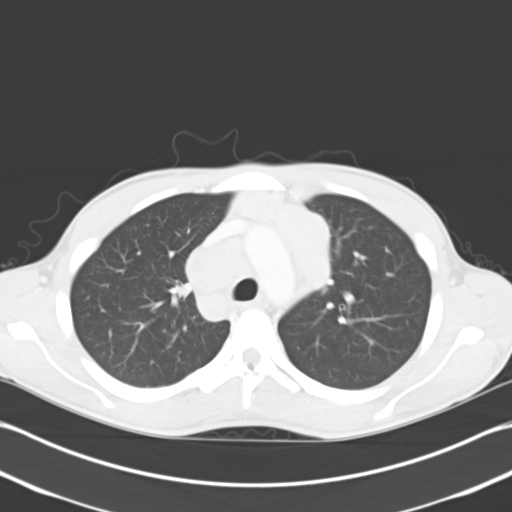

[Series 4: lung windows · axial · 0.67mm/px · z∈[-322,-222]mm · 2 of 60 slices shown]
[im 20/60  lung]
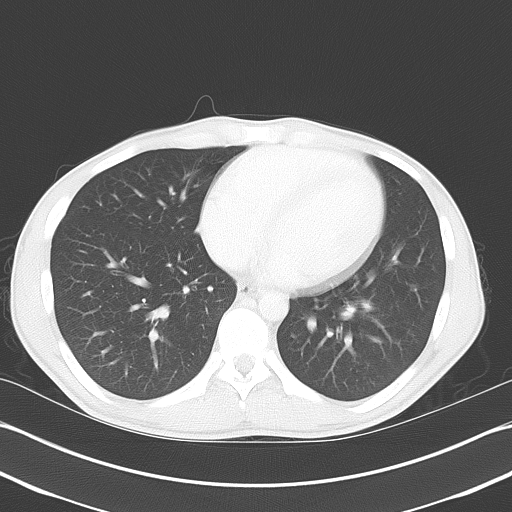
[im 40/60  lung]
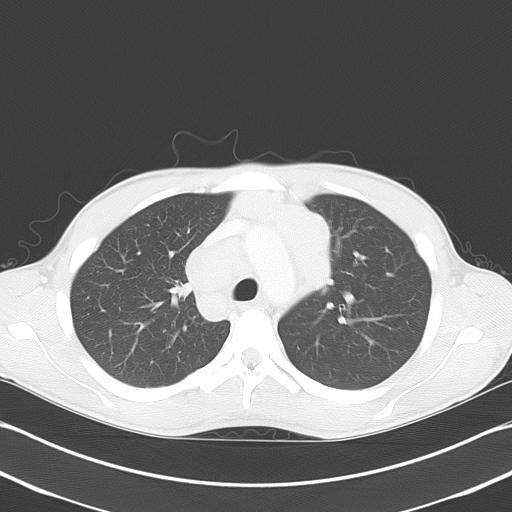

[Series 5: neck · axial · 0.39mm/px · z∈[-156,+14]mm · 6 of 119 slices shown]
[im 17/119  lung]
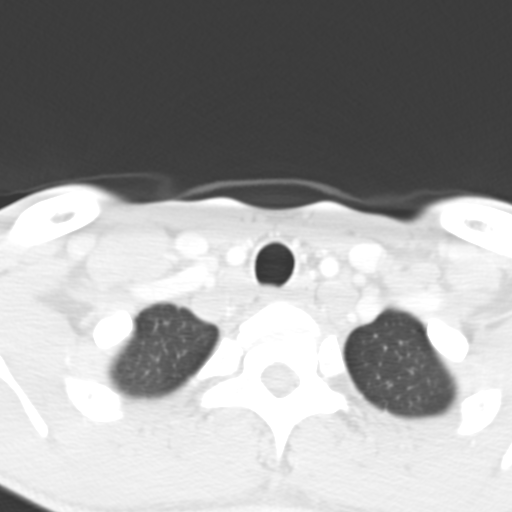
[im 34/119  lung]
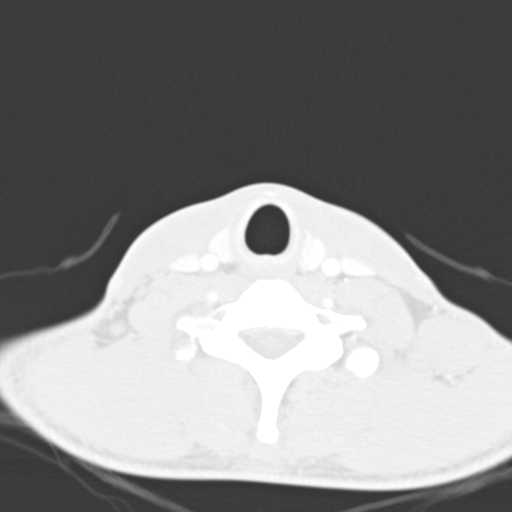
[im 51/119  lung]
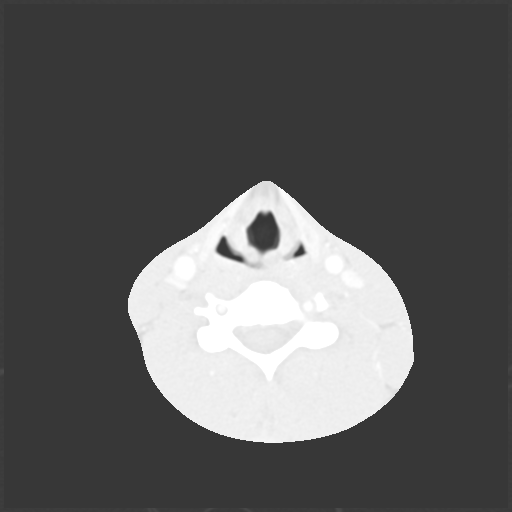
[im 68/119  lung]
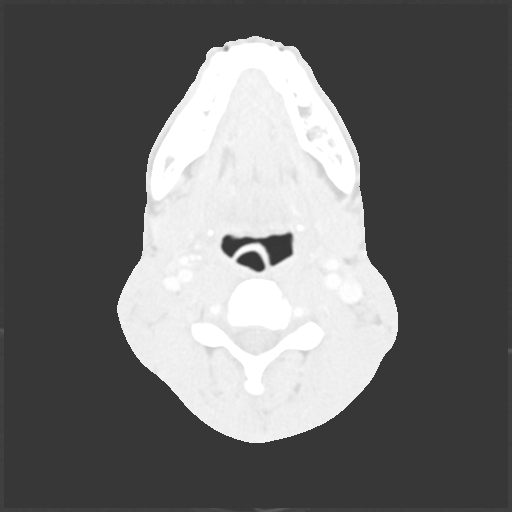
[im 85/119  lung]
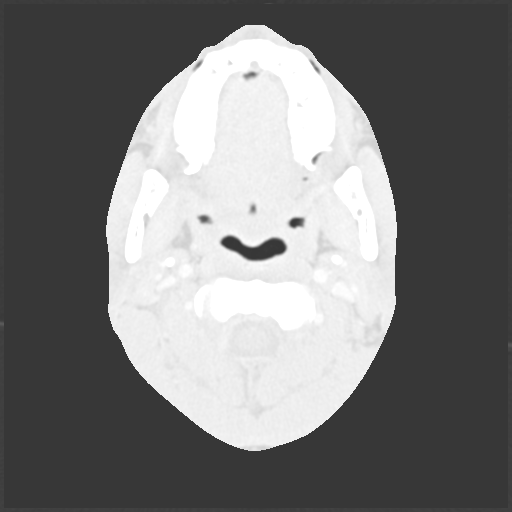
[im 102/119  lung]
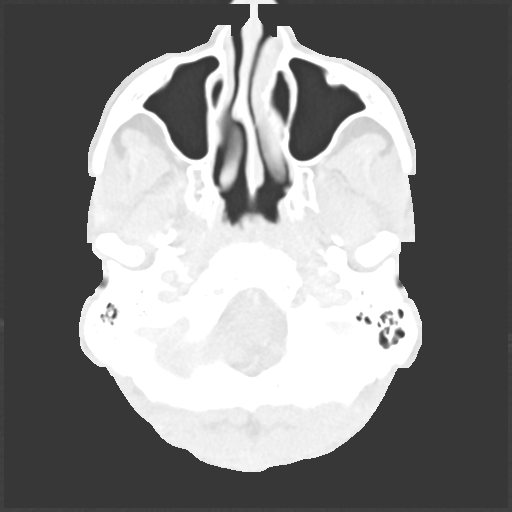

[Series 604: <mpr thick range(2)> · coronal · 0.46mm/px · 1 of 97 slices shown]
[im 49/97  lung]
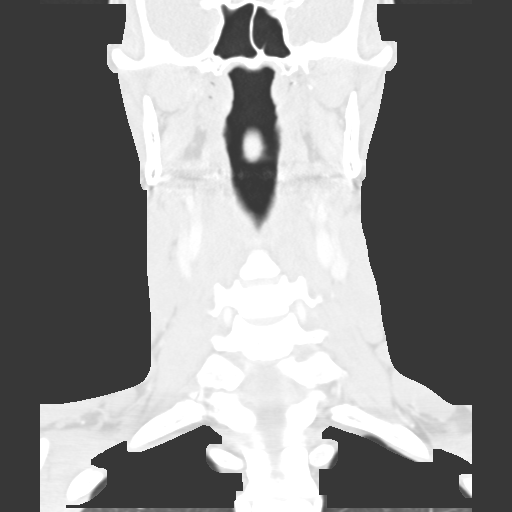

[15 of 36 positions shown; findings below may reference images not displayed]

FINDINGS: Lymphadenopathy is seen in the lower neck in the
supraclavicular regions and posterior triangles bilaterally.  Bulky
lymphadenopathy is seen within the mediastinum, with index node in
the right paratracheal region measuring 3.3 cm on image 19.
Bilateral hilar lymphadenopathy is also seen, with right hilar
lymph node measuring 2.4 cm on image 28, and left hilar lymph node
measuring 1.2 cm on image 29.  There is also a 1.9 cm lymph node in
the right cardiophrenic angle on image 46.

No evidence of pleural or pericardial effusion.  Both lungs are
clear.  No evidence of axillary lymphadenopathy or chest wall mass.
Obvious sites of skeletal involvement seen on PET scan also
performed today are not well visualized by CT.
IMPRESSION: 1.  Bulky mediastinal and bilateral hilar lymphadenopathy.
2.  Lower cervical lymphadenopathy also noted in the
supraclavicular regions and posterior triangles bilaterally.

CT ABDOMEN AND PELVIS
FINDINGS: Bulky lymphadenopathy is seen within the upper abdomen
with largest confluent lymph node mass measuring 5.1 cm in the
porta hepatis on image 62.  This lymphadenopathy also involves the
gastrohepatic and gastrosplenic ligaments and gastrocolic ligament.
There is mild retroperitoneal lymphadenopathy seen in the
retrocaval, aortocaval, and left periaortic spaces.

Numerous low attenuation lesions are also seen throughout the
spleen, consistent with splenic involvement by lymphoma.  The
spleen remains within normal limits in size however.

The liver, pancreas, adrenal glands, and kidneys are normal
appearance.  No evidence of hydronephrosis.

Mild bilateral external iliac chain lymphadenopathy is seen within
the pelvis.  There is also lymphadenopathy seen in the right
obturator region with index lymph node measuring 2.2 cm on image
117.

No evidence of inflammatory process or abnormal fluid collections.
No evidence of bowel obstruction.  Despite obvious skeletal
involvement by PET scan, bone lesions are not well visualized by
CT.
IMPRESSION: 1.  Abdominal and pelvic lymphadenopathy, as described above.
2.  Splenic involvement by lymphoma.
3.  Diffuse bone marrow involvement is obvious by PET scan, but
poorly visualized by CT.

## 2014-08-11 IMAGING — US US BIOPSY
1 series · 13 of 13 positions shown · non-contrast
Comparison: none

Clinical Data/Indication: Lymphadenopathy

ULTRASOUND-GUIDED BIOPSY OF A LEFT SUPRACLAVICULAR LYMPH NODE.
CORE.
Sedation: Versed 3.0 mg, Fentanyl 150 mcg.
Total Moderate Sedation Time: Hand minutes.
Procedure: The procedure, risks, benefits, and alternatives were
explained to the patient. Questions regarding the procedure were
encouraged and answered. The patient understands and consents to
the procedure.
The  was prepped with betadine in a sterile fashion, and a sterile
drape was applied covering the operative field. A sterile gown and
sterile gloves were used for the procedure.
Under sonographic guidance, four 18 gauge core biopsies of the
enlarged left supraclavicular lymph node were obtained. Final
imaging was performed.
Patient tolerated the procedure well without complication.  Vital
sign monitoring by nursing staff during the procedure will continue
as patient is in the special procedures unit for post procedure
observation.

[Series 1: us biopsy · 0.06mm/px · 13 of 13 slices shown]
[im 1/13]
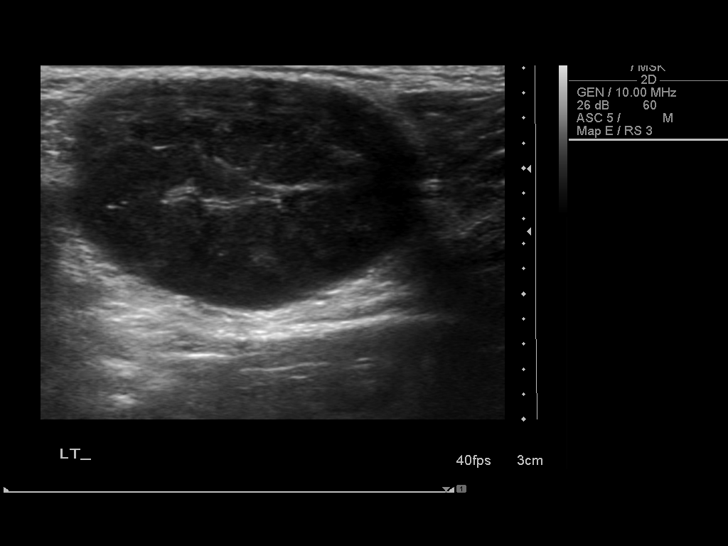
[im 2/13]
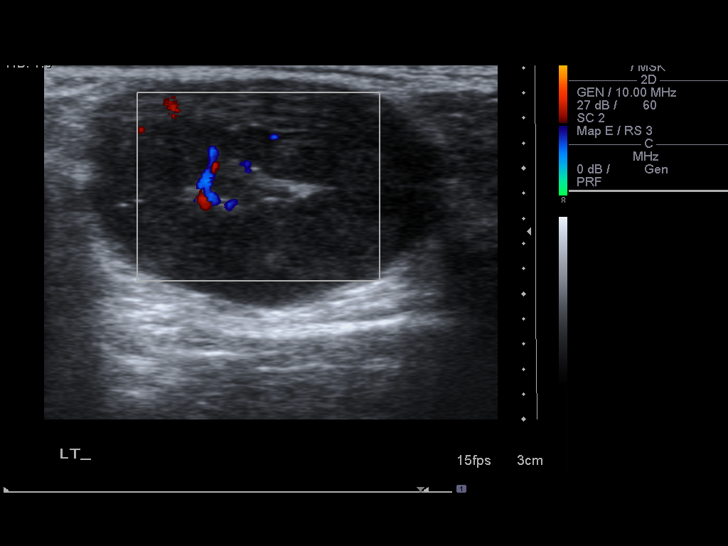
[im 3/13]
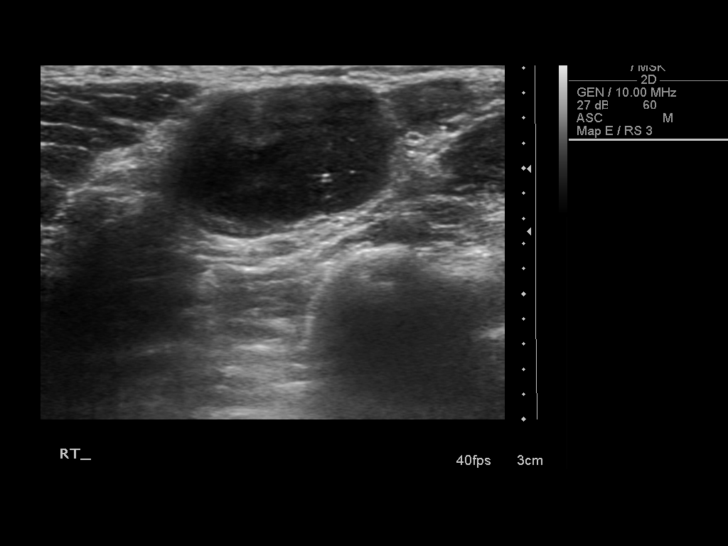
[im 4/13]
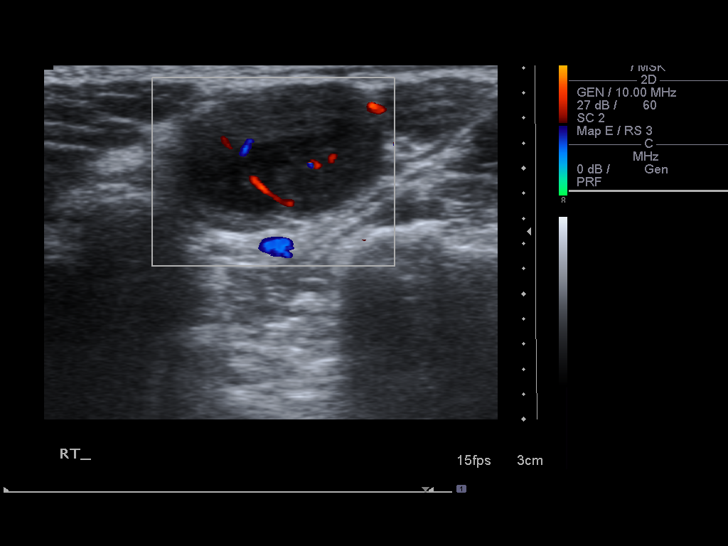
[im 5/13]
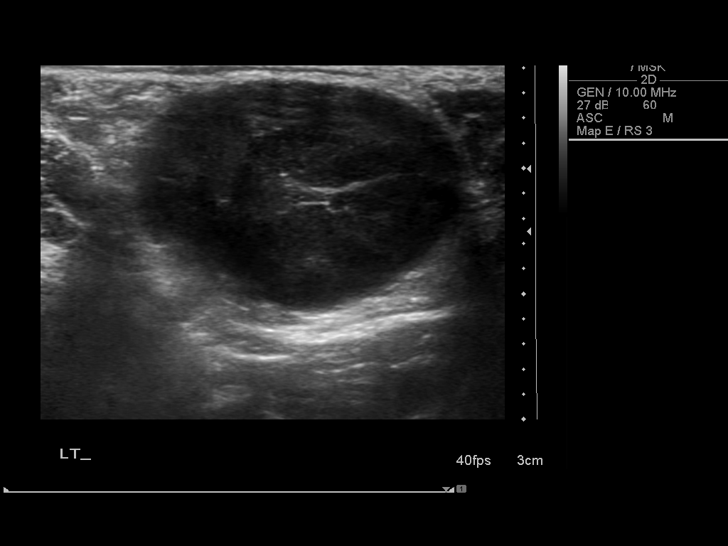
[im 6/13]
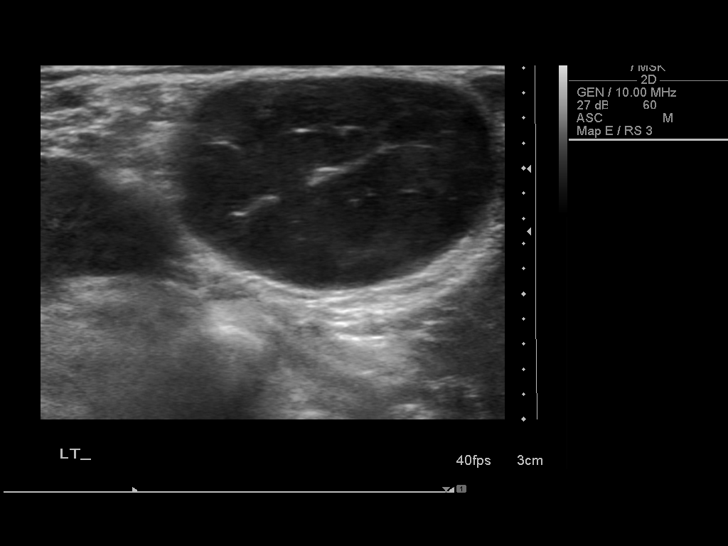
[im 7/13]
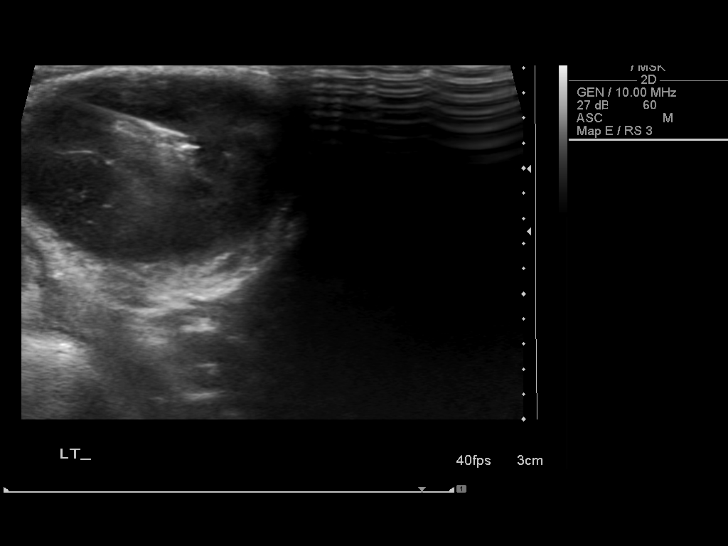
[im 8/13]
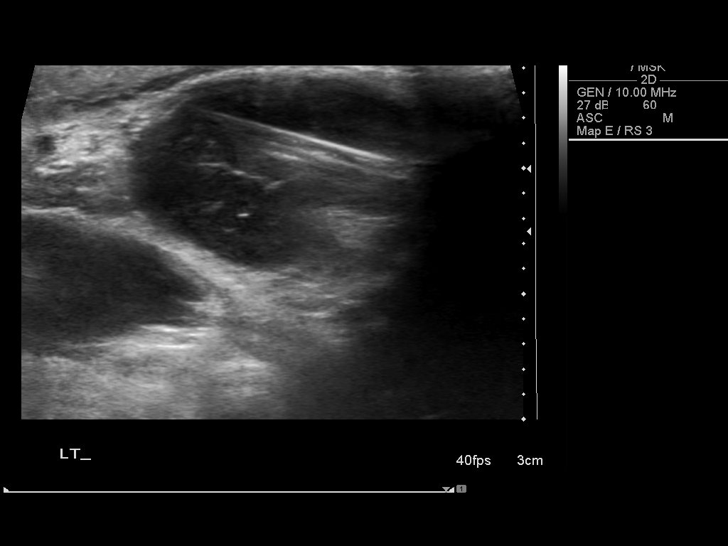
[im 9/13]
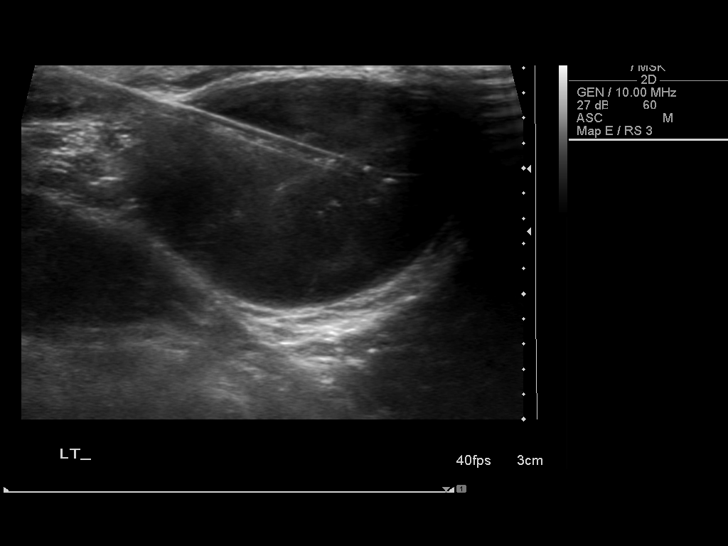
[im 10/13]
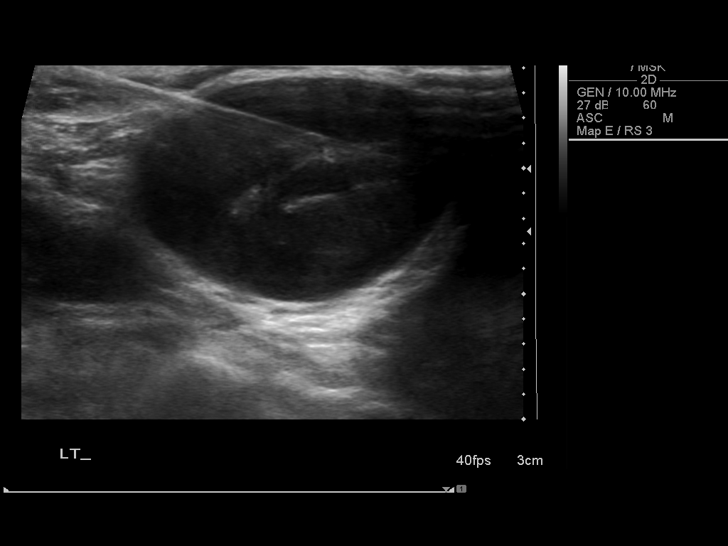
[im 11/13]
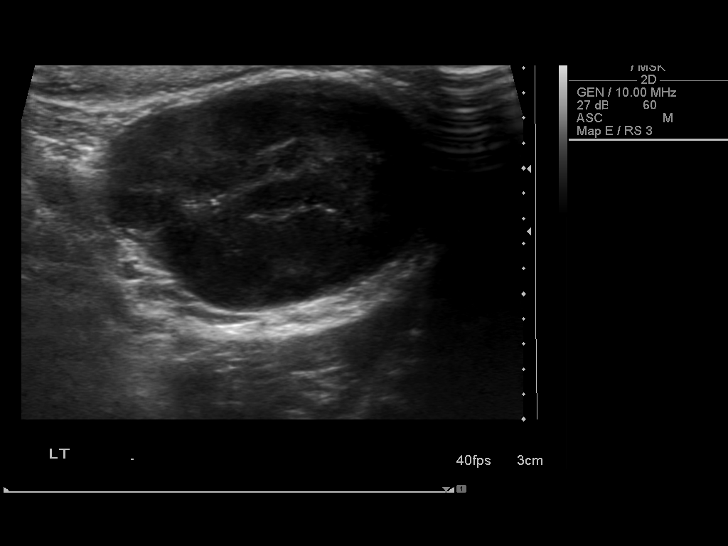
[im 12/13]
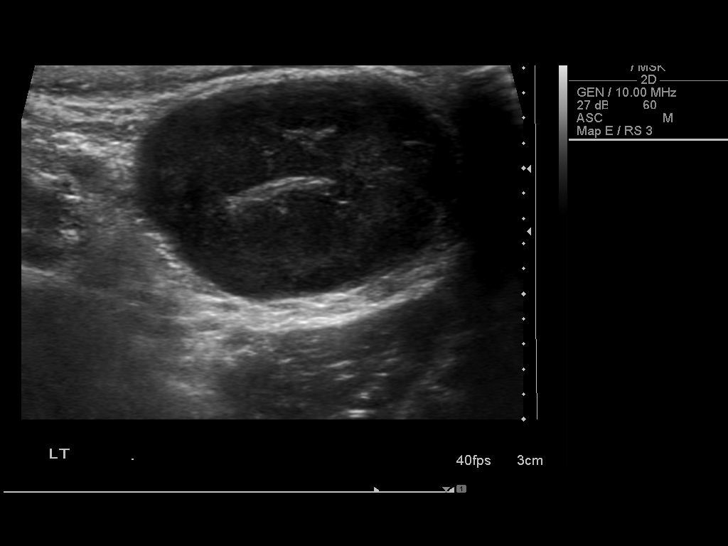
[im 13/13]
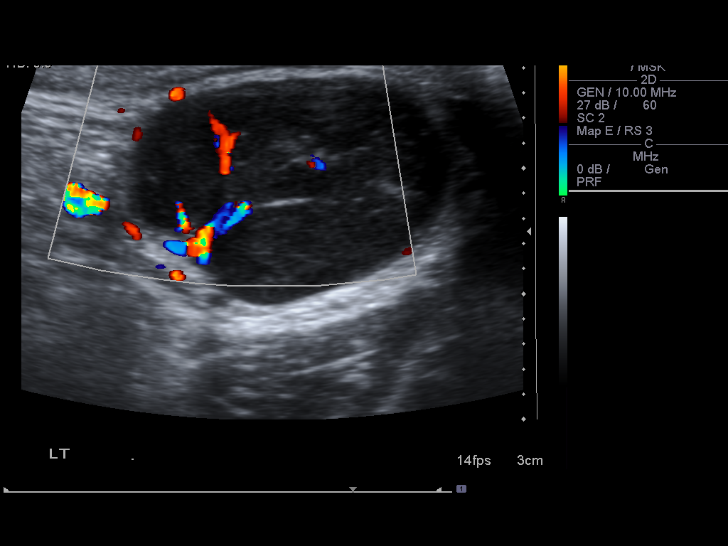

[13 of 13 positions shown; findings below may reference images not displayed]

FINDINGS: The images document guide needle placement within the
enlarged left supraclavicular lymph node. Post biopsy images
demonstrate no evidence of hemorrhage.
IMPRESSION: Successful ultrasound-guided core biopsy of a enlarged left
supraclavicular lymph node.

## 2019-02-06 ENCOUNTER — Other Ambulatory Visit: Payer: Self-pay

## 2019-02-06 ENCOUNTER — Encounter (HOSPITAL_COMMUNITY): Payer: Self-pay

## 2019-02-06 ENCOUNTER — Emergency Department (HOSPITAL_COMMUNITY)
Admission: EM | Admit: 2019-02-06 | Discharge: 2019-02-06 | Disposition: A | Discharge status: Home / Self Care | Payer: Self-pay | Attending: Emergency Medicine | Admitting: Emergency Medicine

## 2019-02-06 DIAGNOSIS — K0889 Other specified disorders of teeth and supporting structures: Secondary | ICD-10-CM | POA: Insufficient documentation

## 2019-02-06 DIAGNOSIS — R220 Localized swelling, mass and lump, head: Secondary | ICD-10-CM | POA: Insufficient documentation

## 2019-02-06 DIAGNOSIS — Z5321 Procedure and treatment not carried out due to patient leaving prior to being seen by health care provider: Secondary | ICD-10-CM | POA: Insufficient documentation

## 2019-02-06 NOTE — ED Triage Notes (Signed)
Pt presents with c/o probable infected tooth on the upper left side of his mouth with associated facial swelling. Pt does have an upcoming dentist appointment on Wednesday.

## 2019-03-17 ENCOUNTER — Encounter (HOSPITAL_COMMUNITY): Payer: Self-pay | Admitting: *Deleted

## 2019-03-17 ENCOUNTER — Other Ambulatory Visit: Payer: Self-pay

## 2019-03-17 ENCOUNTER — Emergency Department (HOSPITAL_COMMUNITY)
Admission: EM | Admit: 2019-03-17 | Discharge: 2019-03-17 | Disposition: A | Discharge status: Home / Self Care | Payer: Self-pay | Attending: Emergency Medicine | Admitting: Emergency Medicine

## 2019-03-17 DIAGNOSIS — R0981 Nasal congestion: Secondary | ICD-10-CM | POA: Insufficient documentation

## 2019-03-17 DIAGNOSIS — R0789 Other chest pain: Secondary | ICD-10-CM | POA: Insufficient documentation

## 2019-03-17 DIAGNOSIS — U071 COVID-19: Secondary | ICD-10-CM | POA: Insufficient documentation

## 2019-03-17 HISTORY — DX: Non-Hodgkin lymphoma, unspecified, unspecified site: C85.90

## 2019-03-17 LAB — POC SARS CORONAVIRUS 2 AG -  ED: SARS Coronavirus 2 Ag: POSITIVE — AB

## 2019-03-17 MED ORDER — BUTALBITAL-APAP-CAFFEINE 50-325-40 MG PO TABS
1.0000 | ORAL_TABLET | Freq: Once | ORAL | Status: AC
  Administered 2019-03-17: 12:00:00 1 via ORAL
  Filled 2019-03-17: qty 1
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
## 2019-03-17 NOTE — ED Triage Notes (Signed)
Headache worse at night the last day, without relief with OTC meds, 2 nights of waking with night sweats. Concerned due to the same symptoms occurred when he had cancer.

## 2019-03-17 NOTE — Discharge Instructions (Addendum)
It was a pleasure meeting you today. Most likely, the reason for your headache is that you have COVID-19. Please self isolate for 10 days. If you develop progressive symptoms, please return for further evaluation.

## 2019-08-23 ENCOUNTER — Other Ambulatory Visit: Payer: Self-pay

## 2019-08-23 ENCOUNTER — Emergency Department (HOSPITAL_COMMUNITY)
Admission: EM | Admit: 2019-08-23 | Discharge: 2019-08-23 | Disposition: A | Discharge status: Home / Self Care | Payer: Self-pay | Attending: Emergency Medicine | Admitting: Emergency Medicine

## 2019-08-23 ENCOUNTER — Encounter (HOSPITAL_COMMUNITY): Payer: Self-pay

## 2019-08-23 DIAGNOSIS — R519 Headache, unspecified: Secondary | ICD-10-CM | POA: Insufficient documentation

## 2019-08-23 DIAGNOSIS — Z5321 Procedure and treatment not carried out due to patient leaving prior to being seen by health care provider: Secondary | ICD-10-CM | POA: Insufficient documentation

## 2019-08-23 DIAGNOSIS — R05 Cough: Secondary | ICD-10-CM | POA: Insufficient documentation

## 2019-08-23 DIAGNOSIS — R0981 Nasal congestion: Secondary | ICD-10-CM | POA: Insufficient documentation

## 2019-08-23 NOTE — ED Triage Notes (Signed)
Patient arrived stating he has had coughing and congestion since Thursday and now a headache for two days. States he took [facility] powders with no relief.
